# Patient Record
Sex: Female | Born: 1975 | Hispanic: Yes | Marital: Married | Smoking: Never smoker
Health system: Southern US, Community
[De-identification: ages and names within clinical notes are randomized; demographics above are authoritative.]

## PROBLEM LIST (undated history)

## (undated) DIAGNOSIS — N879 Dysplasia of cervix uteri, unspecified: Secondary | ICD-10-CM

## (undated) DIAGNOSIS — N393 Stress incontinence (female) (male): Secondary | ICD-10-CM

## (undated) DIAGNOSIS — E559 Vitamin D deficiency, unspecified: Secondary | ICD-10-CM

## (undated) DIAGNOSIS — Z87410 Personal history of cervical dysplasia: Secondary | ICD-10-CM

## (undated) DIAGNOSIS — Z8619 Personal history of other infectious and parasitic diseases: Secondary | ICD-10-CM

## (undated) DIAGNOSIS — M549 Dorsalgia, unspecified: Secondary | ICD-10-CM

## (undated) HISTORY — DX: Personal history of other infectious and parasitic diseases: Z86.19

---

## 2007-02-05 LAB — CONVERTED CEMR LAB: Pap Smear: NORMAL

## 2007-04-20 ENCOUNTER — Encounter (INDEPENDENT_AMBULATORY_CARE_PROVIDER_SITE_OTHER): Payer: Self-pay | Admitting: *Deleted

## 2007-05-01 ENCOUNTER — Encounter: Payer: Self-pay | Admitting: Internal Medicine

## 2008-02-21 ENCOUNTER — Ambulatory Visit: Payer: Self-pay | Admitting: *Deleted

## 2008-02-21 DIAGNOSIS — N329 Bladder disorder, unspecified: Secondary | ICD-10-CM | POA: Insufficient documentation

## 2008-02-21 DIAGNOSIS — R32 Unspecified urinary incontinence: Secondary | ICD-10-CM | POA: Insufficient documentation

## 2008-02-21 DIAGNOSIS — F329 Major depressive disorder, single episode, unspecified: Secondary | ICD-10-CM | POA: Insufficient documentation

## 2008-02-21 DIAGNOSIS — M5430 Sciatica, unspecified side: Secondary | ICD-10-CM

## 2011-05-31 ENCOUNTER — Encounter: Payer: Self-pay | Admitting: Gynecology

## 2011-05-31 ENCOUNTER — Ambulatory Visit (INDEPENDENT_AMBULATORY_CARE_PROVIDER_SITE_OTHER): Payer: Managed Care, Other (non HMO) | Admitting: Gynecology

## 2011-05-31 VITALS — BP 110/68 | Ht 59.5 in | Wt 165.0 lb

## 2011-05-31 DIAGNOSIS — N871 Moderate cervical dysplasia: Secondary | ICD-10-CM

## 2011-05-31 NOTE — Patient Instructions (Signed)
Patient information: Management of high grade cervical squamous intraepithelial lesions (HSIL) and glandular abnormalities (AGC) (Beyond the Basics)  Authors Lanna Poche, MD Thayer Ohm, MD Section Editor Alvera Novel, MD Deputy Editor Morton Amy, MD Disclosures  All topics are updated as new evidence becomes available and our peer review process is complete.  Literature review current through: Feb 2013.  This topic last updated: May 02, 2011.  ABNORMAL PAP SMEAR OVERVIEW -- High grade cervical squamous intraepithelial lesion (HSIL, also called high grade cervical intraepithelial neoplasia) is the name given to moderately to severely abnormal-appearing cells on a Pap smear (also called a cervical cytology test). Any woman with HSIL requires further evaluation to determine if cancerous cells are present. While only about 2 percent of women with HSIL have invasive cancer, up to 20 percent of women with HSIL will eventually develop cancer if the abnormality is not treated. Atypical glandular cells (AGC) is the name given to abnormal appearing glandular cells on a cervical cytology test. Glandular cells line the opening in the cervix (picture 1). AGC is a relatively uncommon result, although it always requires further evaluation. AGC can be caused by benign conditions, such as cervical polyps, or more serious conditions, such as cancer of the cervix, uterine lining (endometrium), ovary, or fallopian tube. This topic review discusses the management of women with high grade squamous intraepithelial lesions (HSIL) and glandular abnormalities (AGC) of the cervix. Management of atypical squamous cells (ASC-US and ASC-H) and low grade squamous intraepithelial lesions (LSIL) is discussed separately. (See "Patient information: Management of atypical squamous cells (ASC-US and ASC-H) and low grade cervical squamous intraepithelial lesions (LSIL) (Beyond the Basics)".) Cervical cancer  screening tests are also discussed in a separate topic review. (See "Patient information: Cervical cancer screening (Beyond the Basics)".) HIGH-GRADE SQUAMOUS LESION (HSIL) -- HSIL refers to moderate to severe precancerous changes of the cells of the cervix. Approximately 2 percent of women with HSIL on a Pap smear are found to have invasive cervical cancer when they undergo further evaluation and another 20 percent of women with HSIL will develop cervical cancer over a period of several years if they are not treated. However, if the precancerous lesion is removed or destroyed, cervical cancer can usually be prevented. Evaluation of HSIL -- All women with high-grade (HSIL) on Pap smear should have one of the following: Colposcopy of the cervix, including biopsy of any abnormal areas and endocervical curettage (ECC). Management after colposcopy depends upon the results. (See 'Colposcopy' below.)   If the healthcare provider is unable to see the entire cervix during colposcopy, surgical removal of the abnormal area is recommended (eg, loop electrosurgical excision, conization). (See "Patient information: Treatment of precancerous cells of the cervix (Beyond the Basics)".)  Alternatively, the woman and her provider may decide to remove the abnormal area at the time of the initial colposcopy. This is called "see and treat". (See "Patient information: Treatment of precancerous cells of the cervix (Beyond the Basics)".) This option is not recommended for adolescents and pregnant women. (See 'Special populations' below.) COLPOSCOPY -- Colposcopy is an office procedure that allows a clinician to closely examine the cervix. It is commonly performed after an abnormal Pap smear. Colposcopy is performed while the woman lies on an examination table, similar to a routine pelvic examination. A speculum is used to view the cervix, and the viewing device (called a colposcope) remains outside the woman's body (picture 1). The  colposcope magnifies the appearance of the cervix. This allows  the clinician to better see the location and size of any abnormalities, and also to see any changes in the capillaries (small blood vessels) on the surface of the cervix. Capillary changes are not detected by cervical cytology or human papillomavirus (HPV tests), but are important signs of the severity of cervical abnormalities. During colposcopy, a small piece of the abnormal area can be removed (biopsied). Anesthesia (numbing medicine) is not needed because the biopsy causes only mild discomfort or cramping. Women with HSIL or AGC usually require a biopsy of the inner cervix during colposcopy; this is called endocervical curettage (ECC). Endocervix refers to the inner cervix and curettage means scraping. Pregnant women should not have ECC because it may disturb the pregnancy. Management of HSIL after colposcopy -- Most women with HSIL results on a Pap smear will have a biopsy of any abnormal-appearing areas during colposcopy. The biopsy samples are sent to a pathologist, who determines if there is any evidence of precancerous changes, termed cervical intraepithelial neoplasia (CIN). These changes are categorized as being mild (CIN 1) or moderate to severe (CIN 2 or 3). The following management strategies apply to non-pregnant women who are 39 years-old or older. Management of adolescents and pregnant women is discussed separately (see 'Special populations' below). CIN 2 or 3 -- If the healthcare provider is able to see the entire cervix during colposcopy and the biopsy shows CIN 2 or 3, treatment to remove (excise) the abnormal area is recommended to prevent cancer. Delaying treatment (eg, watching and waiting) is not recommended for women age 66 or older with CIN 2 or 3, given the high risk of progression to cancer. (See "Patient information: Treatment of precancerous cells of the cervix (Beyond the Basics)".) CIN 1 or less preceded by HSIL --  Colposcopy can miss a significant number of seriously abnormal CIN lesions. If a woman has HSIL but the colposcopy/biopsy do not show a high grade lesion, the woman and her provider need to decide what else should be done to make sure a serious lesion has not been missed. The following options are available: Close monitoring, including cervical cytology and colposcopy at 6 and 12 months. At these visits, the provider must be able to see the entire cervix during colposcopy and a test of the inner cervix (called endocervical curettage) must be negative. This may be the preferred approach for younger women who would like to preserve their ability to carry a pregnancy in the future.  If these tests are negative, the woman may return to once yearly testing.  If either test show persistent HSIL, a treatment to remove the abnormal area is recommended. (See "Patient information: Treatment of precancerous cells of the cervix (Beyond the Basics)".)  Remove the abnormal area. Excision is recommended because it can both treat any abnormal areas and determine with certainty what abnormality was present. (See "Patient information: Treatment of precancerous cells of the cervix (Beyond the Basics)".)  In some cases, a healthcare provider will request an expert review of the woman's cytology and biopsy. This generally involves sending the cervical cytology and biopsy slides to an outside pathologist who is expert in evaluating abnormal Pap smears. If the expert feels that the woman has moderate to severe changes, a treatment to remove the abnormal area may be recommended. If the pathologist feels that there are mild to moderate changes, the woman and her provider may elect to monitor these changes with cervical cytology and colposcopy every six months.

## 2011-05-31 NOTE — Progress Notes (Signed)
Peggy Green is a gravida 2 para 2 that was referred to our practice from the general medical clinic in [place] (Dr. 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 Colposcopic evaluation in our office today as follows   Physical Exam  Genitourinary:       Pertinent Gynecological History: Menses: flow is moderate Bleeding: Moderate Contraception: condoms and Recent ParaGard IUD removed DES exposure: denies Blood transfusions: none Sexually transmitted diseases: Chlamydia Previous GYN Procedures: Colposcopy and biopsy  Last mammogram: No prior study Date: No prior study Last pap: See above Date: See above OB History: G2, P2   Menstrual History: Menarche age: 14 Patient's last menstrual period was 05/05/2011. Period Cycle (Days): 30  Period Duration (Days): 7-14 Period Pattern: Irregular Menstrual Flow: Heavy Dysmenorrhea: Severe Dysmenorrhea Symptoms: Cramping  History reviewed. No pertinent past medical history.  History reviewed. No pertinent past surgical history.  Family History  Problem Relation Age of Onset  . Cancer Maternal Grandfather     STOMACH    Social History:  reports that she has never smoked. She has never used smokeless tobacco. She reports that she drinks alcohol. She reports that she does not use illicit  drugs.  Allergies: No Known Allergies   (Not in a hospital admission)  @ROS@  Blood pressure 110/68, height 4' 11.5" (1.511 m), weight 165 lb (74.844 kg), last menstrual period 05/05/2011.  Physical Exam:  HEENT:unremarkable Neck:Supple, midline, no thyroid megaly, no carotid bruits Lungs:  Clear to auscultation no rhonchi's or wheezes Heart:Regular rate and rhythm, no murmurs or gallops Breast Exam: Not examined Abdomen: Soft nontender no rebound or guarding Pelvic:BUS no gross lesions on inspection or by colposcopic evaluation Vagina: No lesions seen on colposcopic evaluation Cervix: Please refer to above picture on colposcopic evaluation Uterus: Anteverted upper limits of normal Adnexa: No palpable masses or tenderness Extremities: No cords, no edema Rectal: Not done  No results found for this or any previous visit (from the past 24 hour(s)).  No results found.  Assessment/Plan: 35-year-old gravida 2 para 2 with CIN-1 (low-grade squamous intraepithelial lesion) on recent Pap smear. Patient underwent colposcopy and biopsy by her primary physician which demonstrated at the 6:00 position CIN-2 (high-grade squamous intraepithelial lesion/HPV). The high-grade dysplasia that was noted on colposcopic evaluation concur with the colposcopy done in our office today but due to to the large surface area it would be best evaluated and treated by doing the LEEP conization or possibly cold knife conization after application of Lugol solution in an outpatient surgical center under intravenous sedation and paracervical block. The above findings were discussed with the patient and her husband and the procedure recommended. The risks benefits and pros and cons were outlined. To include potential cervical stenosis infertility and cervical incompetence in the future. Patient not planning having more children so she states that this would not be an issue in the future. We discussed potential risk of  infection hemorrhage. All questions were answered and we'll schedule accordingly.  Peggy Green 05/31/2011, 4:48   PM   

## 2011-06-01 ENCOUNTER — Telehealth: Payer: Self-pay

## 2011-06-01 NOTE — Telephone Encounter (Signed)
Left message for patient to call me. I am holding time for Thursday April 4 7:30am and wondered if that might work for her.

## 2011-06-02 ENCOUNTER — Telehealth: Payer: Self-pay

## 2011-06-02 NOTE — Telephone Encounter (Signed)
Sister, Kandis Cocking called. She said patient told her I had left message that i was holding April  4th for her and she was supposed to call me back. Kandis Cocking is asking if anything sooner. I told her we could schedule her for March 26 7:30am.  She said she is going to call sister and talk with her about it and one of them will call me back. Meantime I scheduled the case to hold the time for Tuesday morning.

## 2011-06-06 ENCOUNTER — Telehealth: Payer: Self-pay

## 2011-06-06 NOTE — Telephone Encounter (Signed)
Patient returned my call.  I explained that I cannot talk with sister, Kandis Cocking regarding her health situation or her insurance benefits.  I told patient if she wished [place] to speak with her she would need to sign a HIPPA form and list her sister on it as being okay for [place] to communicate with regarding patient.  Patient is scheduled for surgery on March 26, 7:30am and we discussed her insurance benefits in detail.  She will call me if she has any questions.

## 2011-06-07 ENCOUNTER — Encounter (HOSPITAL_BASED_OUTPATIENT_CLINIC_OR_DEPARTMENT_OTHER): Payer: Self-pay | Admitting: *Deleted

## 2011-06-08 ENCOUNTER — Encounter (HOSPITAL_BASED_OUTPATIENT_CLINIC_OR_DEPARTMENT_OTHER): Payer: Self-pay | Admitting: *Deleted

## 2011-06-08 NOTE — Progress Notes (Signed)
NPO AFTER MN. ARRIVES AT 0600. NEEDS CBC, UA, AND URINE PREG. 

## 2011-06-14 ENCOUNTER — Encounter (HOSPITAL_BASED_OUTPATIENT_CLINIC_OR_DEPARTMENT_OTHER)
Admission: RE | Disposition: A | Discharge status: Home / Self Care | Payer: Self-pay | Source: Ambulatory Visit | Attending: Gynecology

## 2011-06-14 ENCOUNTER — Ambulatory Visit (HOSPITAL_BASED_OUTPATIENT_CLINIC_OR_DEPARTMENT_OTHER)
Admission: RE | Admit: 2011-06-14 | Discharge: 2011-06-14 | Disposition: A | Discharge status: Home / Self Care | Payer: Managed Care, Other (non HMO) | Source: Ambulatory Visit | Attending: Gynecology | Admitting: Gynecology

## 2011-06-14 ENCOUNTER — Encounter (HOSPITAL_BASED_OUTPATIENT_CLINIC_OR_DEPARTMENT_OTHER): Payer: Self-pay | Admitting: Anesthesiology

## 2011-06-14 ENCOUNTER — Ambulatory Visit (HOSPITAL_BASED_OUTPATIENT_CLINIC_OR_DEPARTMENT_OTHER): Payer: Managed Care, Other (non HMO) | Admitting: Anesthesiology

## 2011-06-14 ENCOUNTER — Encounter (HOSPITAL_BASED_OUTPATIENT_CLINIC_OR_DEPARTMENT_OTHER): Payer: Self-pay | Admitting: *Deleted

## 2011-06-14 DIAGNOSIS — N871 Moderate cervical dysplasia: Secondary | ICD-10-CM

## 2011-06-14 HISTORY — PX: LEEP: SHX91

## 2011-06-14 HISTORY — DX: Dysplasia of cervix uteri, unspecified: N87.9

## 2011-06-14 LAB — URINALYSIS, ROUTINE W REFLEX MICROSCOPIC
Bilirubin Urine: NEGATIVE
Hgb urine dipstick: NEGATIVE
Nitrite: NEGATIVE
Specific Gravity, Urine: 1.028 (ref 1.005–1.030)
Urobilinogen, UA: 0.2 mg/dL (ref 0.0–1.0)
pH: 5.5 (ref 5.0–8.0)

## 2011-06-14 SURGERY — LEEP (LOOP ELECTROSURGICAL EXCISION PROCEDURE)
Anesthesia: General | Site: Vagina | Wound class: Clean Contaminated

## 2011-06-14 MED ORDER — ONDANSETRON HCL 4 MG/2ML IJ SOLN
INTRAMUSCULAR | Status: DC | PRN
  Administered 2011-06-14: 4 mg via INTRAVENOUS

## 2011-06-14 MED ORDER — IODINE STRONG (LUGOLS) 5 % PO SOLN
ORAL | Status: DC | PRN
  Administered 2011-06-14: 0.1 mL

## 2011-06-14 MED ORDER — OXYCODONE-ACETAMINOPHEN 5-500 MG PO CAPS: 1.0000 | ORAL_CAPSULE | ORAL | Status: AC | PRN

## 2011-06-14 MED ORDER — FENTANYL CITRATE 0.05 MG/ML IJ SOLN
INTRAMUSCULAR | Status: DC | PRN
  Administered 2011-06-14: 50 ug via INTRAVENOUS
  Administered 2011-06-14: 25 ug via INTRAVENOUS
  Administered 2011-06-14: 50 ug via INTRAVENOUS
  Administered 2011-06-14: 25 ug via INTRAVENOUS

## 2011-06-14 MED ORDER — MIDAZOLAM HCL 5 MG/5ML IJ SOLN
INTRAMUSCULAR | Status: DC | PRN
  Administered 2011-06-14: 1 mg via INTRAVENOUS

## 2011-06-14 MED ORDER — PROPOFOL 10 MG/ML IV EMUL
INTRAVENOUS | Status: DC | PRN
  Administered 2011-06-14: 200 mg via INTRAVENOUS

## 2011-06-14 MED ORDER — KETOROLAC TROMETHAMINE 30 MG/ML IJ SOLN
INTRAMUSCULAR | Status: DC | PRN
  Administered 2011-06-14: 30 mg via INTRAVENOUS

## 2011-06-14 MED ORDER — SODIUM CHLORIDE 0.9 % IR SOLN
Status: DC | PRN
  Administered 2011-06-14: 500 mL

## 2011-06-14 MED ORDER — LIDOCAINE-EPINEPHRINE (PF) 1 %-1:200000 IJ SOLN
INTRAMUSCULAR | Status: DC | PRN
  Administered 2011-06-14: 20 mL

## 2011-06-14 MED ORDER — LACTATED RINGERS IV SOLN
INTRAVENOUS | Status: DC
Start: 2011-06-14 — End: 2011-06-14
  Administered 2011-06-14 (×3): via INTRAVENOUS

## 2011-06-14 MED ORDER — FENTANYL CITRATE 0.05 MG/ML IJ SOLN: 25.0000 ug | INTRAMUSCULAR | Status: DC | PRN

## 2011-06-14 MED ORDER — DEXAMETHASONE SODIUM PHOSPHATE 4 MG/ML IJ SOLN
INTRAMUSCULAR | Status: DC | PRN
  Administered 2011-06-14: 10 mg via INTRAVENOUS

## 2011-06-14 MED ORDER — ACETIC ACID 4% SOLUTION
Status: DC | PRN
  Administered 2011-06-14: 1 via TOPICAL

## 2011-06-14 MED ORDER — CLINDAMYCIN PHOSPHATE 2 % VA CREA: 1.0000 | TOPICAL_CREAM | Freq: Every day | VAGINAL | Status: AC

## 2011-06-14 MED ORDER — METOCLOPRAMIDE HCL 10 MG PO TABS: 10.0000 mg | ORAL_TABLET | Freq: Three times a day (TID) | ORAL | Status: AC

## 2011-06-14 MED ORDER — LIDOCAINE HCL (CARDIAC) 20 MG/ML IV SOLN
INTRAVENOUS | Status: DC | PRN
  Administered 2011-06-14: 80 mg via INTRAVENOUS

## 2011-06-14 SURGICAL SUPPLY — 53 items
APPLICATOR COTTON TIP 6IN STRL (MISCELLANEOUS) ×3 IMPLANT
BAG DECANTER FOR FLEXI CONT (MISCELLANEOUS) IMPLANT
BLADE SURG 11 STRL SS (BLADE) ×3 IMPLANT
BLADE SURG 12 STRL SS (BLADE) IMPLANT
CANISTER SUCTION 1200CC (MISCELLANEOUS) IMPLANT
CANISTER SUCTION 2500CC (MISCELLANEOUS) ×3 IMPLANT
CLEANER CAUTERY TIP 5X5 PAD (MISCELLANEOUS) ×2 IMPLANT
CLOTH BEACON ORANGE TIMEOUT ST (SAFETY) ×3 IMPLANT
COVER TABLE BACK 60X90 (DRAPES) ×3 IMPLANT
DRAPE LG THREE QUARTER DISP (DRAPES) IMPLANT
DRAPE UNDERBUTTOCKS STRL (DRAPE) ×3 IMPLANT
ELECT BALL LEEP 3MM BLK (ELECTRODE) ×3 IMPLANT
ELECT BALL LEEP 5MM RED (ELECTRODE) IMPLANT
ELECT LOOP LEEP RND 10X10 YLW (CUTTING LOOP)
ELECT LOOP LEEP RND 15X12 GRN (CUTTING LOOP)
ELECT LOOP LEEP RND 20X12 WHT (CUTTING LOOP) ×3
ELECT REM PT RETURN 9FT ADLT (ELECTROSURGICAL) ×3
ELECTRODE LOOP LP RND 10X10YLW (CUTTING LOOP) IMPLANT
ELECTRODE LOOP LP RND 15X12GRN (CUTTING LOOP) IMPLANT
ELECTRODE LOOP LP RND 20X12WHT (CUTTING LOOP) ×2 IMPLANT
ELECTRODE REM PT RTRN 9FT ADLT (ELECTROSURGICAL) ×2 IMPLANT
GLOVE BIO SURGEON STRL SZ 6.5 (GLOVE) ×3 IMPLANT
GLOVE BIOGEL PI IND STRL 7.5 (GLOVE) ×2 IMPLANT
GLOVE BIOGEL PI INDICATOR 7.5 (GLOVE) ×1
GLOVE ECLIPSE 6.0 STRL STRAW (GLOVE) ×3 IMPLANT
GLOVE ECLIPSE 7.5 STRL STRAW (GLOVE) ×3 IMPLANT
GLOVE INDICATOR 8.0 STRL GRN (GLOVE) ×3 IMPLANT
GLOVE SURG SS PI 7.0 STRL IVOR (GLOVE) ×3 IMPLANT
GOWN STRL REIN XL XLG (GOWN DISPOSABLE) ×3 IMPLANT
GOWN SURGICAL LARGE (GOWNS) ×3 IMPLANT
GOWN SURGICAL XLG (GOWNS) ×6 IMPLANT
GOWN W/2 COTTON TOWELS 2 STD (GOWNS) ×3 IMPLANT
LEGGING LITHOTOMY PAIR STRL (DRAPES) IMPLANT
NDL SAFETY ECLIPSE 18X1.5 (NEEDLE) ×2 IMPLANT
NEEDLE HYPO 18GX1.5 SHARP (NEEDLE) ×1
NS IRRIG 500ML POUR BTL (IV SOLUTION) ×3 IMPLANT
PACK BASIN DAY SURGERY FS (CUSTOM PROCEDURE TRAY) ×3 IMPLANT
PAD CLEANER CAUTERY TIP 5X5 (MISCELLANEOUS) ×1
PAD OB MATERNITY 4.3X12.25 (PERSONAL CARE ITEMS) ×3 IMPLANT
PAD PREP 24X48 CUFFED NSTRL (MISCELLANEOUS) ×3 IMPLANT
PENCIL BUTTON HOLSTER BLD 10FT (ELECTRODE) ×3 IMPLANT
SCOPETTES 8  STERILE (MISCELLANEOUS) ×2
SCOPETTES 8 STERILE (MISCELLANEOUS) ×4 IMPLANT
SUT VIC AB 0 CT1 36 (SUTURE) ×6 IMPLANT
SYR TB 1ML LL NO SAFETY (SYRINGE) ×3 IMPLANT
TOWEL NATURAL 6PK STERILE (DISPOSABLE) ×6 IMPLANT
TOWEL OR 17X24 6PK STRL BLUE (TOWEL DISPOSABLE) ×6 IMPLANT
TRAY DSU PREP LF (CUSTOM PROCEDURE TRAY) IMPLANT
TUBE CONNECTING 12X1/4 (SUCTIONS) ×3 IMPLANT
VACUUM HOSE 7/8X10 W/ WAND (MISCELLANEOUS) IMPLANT
VACUUM HOSE/TUBING 7/8INX6FT (MISCELLANEOUS) ×3 IMPLANT
WATER STERILE IRR 500ML POUR (IV SOLUTION) IMPLANT
YANKAUER SUCT BULB TIP NO VENT (SUCTIONS) IMPLANT

## 2011-06-14 NOTE — Anesthesia Preprocedure Evaluation (Signed)
Anesthesia Evaluation  Patient identified by MRN, date of birth, ID band Patient awake    Reviewed: Allergy & Precautions, H&P , NPO status , Patient's Chart, lab work & pertinent test results, reviewed documented beta blocker date and time   Airway Mallampati: II TM Distance: >3 FB Neck ROM: Full    Dental  (+) Teeth Intact   Pulmonary neg pulmonary ROS,  breath sounds clear to auscultation        Cardiovascular negative cardio ROS  Rhythm:Regular Rate:Normal  Denies cardiac symptoms   Neuro/Psych negative neurological ROS  negative psych ROS   GI/Hepatic negative GI ROS, Neg liver ROS,   Endo/Other  negative endocrine ROS  Renal/GU negative Renal ROS   Cervical dysplasia    Musculoskeletal negative musculoskeletal ROS (+)   Abdominal   Peds negative pediatric ROS (+)  Hematology negative hematology ROS (+)   Anesthesia Other Findings   Reproductive/Obstetrics negative OB ROS                           Anesthesia Physical Anesthesia Plan  ASA: I  Anesthesia Plan: General   Post-op Pain Management:    Induction: Intravenous  Airway Management Planned: LMA  Additional Equipment:   Intra-op Plan:   Post-operative Plan: Extubation in OR  Informed Consent: I have reviewed the patients History and Physical, chart, labs and discussed the procedure including the risks, benefits and alternatives for the proposed anesthesia with the patient or authorized representative who has indicated his/her understanding and acceptance.   Dental advisory given  Plan Discussed with: CRNA and Surgeon  Anesthesia Plan Comments:         Anesthesia Quick Evaluation

## 2011-06-14 NOTE — Op Note (Signed)
06/14/2011  8:33 AM  PATIENT:  Peggy Green  36 y.o. 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)  Colposcopic directed biopsy 05/13/2011 as follows:  Benign ECC  Cervical biopsy 1:00 koilocytotic atypia benign cervical mucosa  Cervical biopsy 3:00 focal: Koilocytotic atypia benign cervical mucosa  Cervical biopsy 6:00 high-grade squamous intraepithelial lesion (moderate dysplasia/CIN 2)  Cervical biopsy 11:00 fragments of benign endocervical mucosa negative for atypia   POST-OPERATIVE DIAGNOSIS:  high grade cervical dysplasia (CIN II)  PROCEDURE:  Procedure(s): LOOP ELECTROSURGICAL EXCISION PROCEDURE (LEEP)  SURGEON:  Surgeon(s): Ok Edwards, MD  ANESTHESIA:   general  FINDINGS: Acetowhite areas were noted to 6 and 12:00 position as had previously been seen with colposcopic evaluation. Further delineation was noted after Lugol's solution was applied.  DESCRIPTION OF OPERATION: The patient was taken to the operating room where she underwent successful general endotracheal anesthesia. Patient voided before she was brought back to the operating room. She was placed in the high lithotomy position and a speculum with sidewall retractors were placed in the vagina. Acetic acid was applied to dissolve the mucous and to enhance potential lesion. Following this Lugol's solution was used the paint the cervix in an effort to delineate the margins of the lesion. Once this was established the [facility] with a wire loop attachment (size 20 x 12) was utilized with the following setting: 30 W on the cutting mode and 40 W on the coagulation mode. An excision of the bottom half of the cervical cone separate from the upper half cervical cone excision had to be undertaken due to the size of the cervix. Following this a cervical button was obtained  separately as well. For hemostasis the working element was changed from the wire loop to the ball electrode to cauterize the cervical bed. (70 W on the [facility]). Silvadene was then used and applied to the cervical bed. The patient received 30 mg of Toradol in route to the recovery room after she was extubated. The specimens were pegged to a cork board and marked appropriately for histological evaluation.  ESTIMATED BLOOD LOSS: None  Intake/Output Summary (Last 24 hours) at 06/14/11 1610 Last data filed at 06/14/11 9604  Gross per 24 hour  Intake   1100 ml  Output      0 ml  Net   1100 ml     BLOOD ADMINISTERED:none   LOCAL MEDICATIONS USED:  LIDOCAINE 1% with 1 in 200,000 epinephrine (paracervical block) 20 cc  SPECIMEN:  Source of Specimen:  Cervical cone biopsy with cervical button  DISPOSITION OF SPECIMEN:  PATHOLOGY  COUNTS:  YES  PLAN OF CARE: Transfer to PACU  [facility] HMD8:33 AMTD@

## 2011-06-14 NOTE — Interval H&P Note (Signed)
History and Physical Interval Note:  06/14/2011 7:27 AM  Peggy Green  has presented today for surgery, with the diagnosis of high grade cervical dysplasia  The various methods of treatment have been discussed with the patient and family. After consideration of risks, benefits and other options for treatment, the patient has consented to  Procedure(s) (LRB): CONIZATION CERVIX WITH BIOPSY (N/A) LOOP ELECTROSURGICAL EXCISION PROCEDURE (LEEP) (N/A) as a surgical intervention .  The patients' history has been reviewed, patient examined, no change in status, stable for surgery.  I have reviewed the patients' chart and labs.  Questions were answered to the patient's satisfaction.     Ok Edwards

## 2011-06-14 NOTE — Anesthesia Postprocedure Evaluation (Signed)
  Anesthesia Post-op Note  Patient: Peggy Green  Procedure(s) Performed: Procedure(s) (LRB): LOOP ELECTROSURGICAL EXCISION PROCEDURE (LEEP) (N/A)  Patient Location: PACU  Anesthesia Type: General  Level of Consciousness: oriented and sedated  Airway and Oxygen Therapy: Patient Spontanous Breathing  Post-op Pain: mild  Post-op Assessment: Post-op Vital signs reviewed, Patient's Cardiovascular Status Stable, Respiratory Function Stable and Patent Airway  Post-op Vital Signs: stable  Complications: No apparent anesthesia complications

## 2011-06-14 NOTE — Transfer of Care (Signed)
Immediate Anesthesia Transfer of Care Note  Patient: Peggy Green  Procedure(s) Performed: Procedure(s) (LRB): LOOP ELECTROSURGICAL EXCISION PROCEDURE (LEEP) (N/A)  Patient Location: Patient transported to PACU with oxygen via face mask at 4 Liters / Min  Anesthesia Type: General  Level of Consciousness: awake and alert   Airway & Oxygen Therapy: Patient Spontanous Breathing and Patient connected to face mask oxygen  Post-op Assessment: Report given to PACU RN and Post -op Vital signs reviewed and stable  Post vital signs: Reviewed and stable  Dentition: Teeth and oropharynx remain in pre-op condition  Complications: No apparent anesthesia complications

## 2011-06-14 NOTE — Anesthesia Procedure Notes (Signed)
Procedure Name: LMA Insertion Date/Time: 06/14/2011 7:39 AM Performed by: Fran Lowes Pre-anesthesia Checklist: Patient identified, Emergency Drugs available, Suction available and Patient being monitored Patient Re-evaluated:Patient Re-evaluated prior to inductionOxygen Delivery Method: Circle System Utilized Preoxygenation: Pre-oxygenation with 100% oxygen Intubation Type: IV induction Ventilation: Mask ventilation without difficulty LMA: LMA inserted LMA Size: 4.0 Number of attempts: 1 Airway Equipment and Method: bite block Placement Confirmation: positive ETCO2 Tube secured with: Tape Dental Injury: Teeth and Oropharynx as per pre-operative assessment  Comments: LMA inserted by Dr. Shireen Quan.

## 2011-06-14 NOTE — H&P (View-Only) (Signed)
Peggy Green is a gravida 2 para 2 that was referred to our practice from the general medical clinic in [place] (Dr. 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 Colposcopic evaluation in our office today as follows   Physical Exam  Genitourinary:       Pertinent Gynecological History: Menses: flow is moderate Bleeding: Moderate Contraception: condoms and Recent ParaGard IUD removed DES exposure: denies Blood transfusions: none Sexually transmitted diseases: Chlamydia Previous GYN Procedures: Colposcopy and biopsy  Last mammogram: No prior study Date: No prior study Last pap: See above Date: See above OB History: G2, P2   Menstrual History: Menarche age: 67 Patient's last menstrual period was 05/05/2011. Period Cycle (Days): 30  Period Duration (Days): 7-14 Period Pattern: Irregular Menstrual Flow: Heavy Dysmenorrhea: Severe Dysmenorrhea Symptoms: Cramping  History reviewed. No pertinent past medical history.  History reviewed. No pertinent past surgical history.  Family History  Problem Relation Age of Onset  . Cancer Maternal Grandfather     STOMACH    Social History:  reports that she has never smoked. She has never used smokeless tobacco. She reports that she drinks alcohol. She reports that she does not use illicit  drugs.  Allergies: No Known Allergies   (Not in a hospital admission)  @ROS @  Blood pressure 110/68, height 4' 11.5" (1.511 m), weight 165 lb (74.844 kg), last menstrual period 05/05/2011.  Physical Exam:  HEENT:unremarkable Neck:Supple, midline, no thyroid megaly, no carotid bruits Lungs:  Clear to auscultation no rhonchi's or wheezes Heart:Regular rate and rhythm, no murmurs or gallops Breast Exam: Not examined Abdomen: Soft nontender no rebound or guarding Pelvic:BUS no gross lesions on inspection or by colposcopic evaluation Vagina: No lesions seen on colposcopic evaluation Cervix: Please refer to above picture on colposcopic evaluation Uterus: Anteverted upper limits of normal Adnexa: No palpable masses or tenderness Extremities: No cords, no edema Rectal: Not done  No results found for this or any previous visit (from the past 24 hour(s)).  No results found.  Assessment/Plan: 36 year old gravida 2 para 2 with CIN-1 (low-grade squamous intraepithelial lesion) on recent Pap smear. Patient underwent colposcopy and biopsy by her primary physician which demonstrated at the 6:00 position CIN-2 (high-grade squamous intraepithelial lesion/HPV). The high-grade dysplasia that was noted on colposcopic evaluation concur with the colposcopy done in our office today but due to to the large surface area it would be best evaluated and treated by doing the LEEP conization or possibly cold knife conization after application of Lugol solution in an outpatient surgical center under intravenous sedation and paracervical block. The above findings were discussed with the patient and her husband and the procedure recommended. The risks benefits and pros and cons were outlined. To include potential cervical stenosis infertility and cervical incompetence in the future. Patient not planning having more children so she states that this would not be an issue in the future. We discussed potential risk of  infection hemorrhage. All questions were answered and we'll schedule accordingly.  Ok Edwards 05/31/2011, 4:48  PM

## 2011-06-15 ENCOUNTER — Encounter (HOSPITAL_BASED_OUTPATIENT_CLINIC_OR_DEPARTMENT_OTHER): Payer: Self-pay | Admitting: Gynecology

## 2011-06-20 ENCOUNTER — Telehealth: Payer: Self-pay | Admitting: *Deleted

## 2011-06-20 NOTE — Telephone Encounter (Signed)
Pt called requesting BX results, left message on pt Vm that results are not back yet.

## 2011-06-28 ENCOUNTER — Ambulatory Visit: Payer: Managed Care, Other (non HMO) | Admitting: Gynecology

## 2011-07-01 ENCOUNTER — Telehealth: Payer: Self-pay | Admitting: *Deleted

## 2011-07-01 NOTE — Telephone Encounter (Signed)
(  PT AWARE YOU ARE OUT OF THE OFFICE) PT IS CALLING TO FOLLOW UP WITH BIOPSY RESULTS FROM LEEP ON 3/26. PLEASE ADVISE

## 2011-07-06 ENCOUNTER — Encounter: Payer: Self-pay | Admitting: Gynecology

## 2011-07-06 NOTE — Progress Notes (Unsigned)
Patient ID: Peggy Green, female   DOB: [birth year], 36 y.o.   MRN: [id number]  Spoke with pathologist 07/05/2011 in reference to patients prior cervical biopsies performed at another institution and asked him to reread the slides and they concurred for HGSIL (CIN II). The LEEP cervical biopsy was benign and he referenced an article based on these clinical findings as outlined below which was shared with patient via telephone and will be discussed when she returns to the office for post op appointment:  Diagnosis 1. Consult Slide , endocervix biopsy - SCANT FRAGMENTS OF BENIGN ENDOCERVICAL GLANDULAR EPITHELIUM, SEE COMMENT. 2. Consult Slide , Cervix biopsy 1 o'clock - CERVICAL MUCOSA WITH KOILOCYTIC ATYPIA CONSISTENT WITH HUMAN PAPILLOMAVIRUS (HPV) EFFECT SEE COMMENT. 3. Consult Slide , Cervix biopsy 3 o'clock - CERVICAL MUCOSA WITH KOILOCYTIC ATYPIA CONSISTENT WITH HUMAN PAPILLOMAVIRUS (HPV) EFFECT SEE COMMENT 4. Consult Slide , Cervix biopsy 6 o'clock - HIGH GRADE SQUAMOUS INTRAEPITHELIAL LESION, CIN-II (MODERATE DYSPLASIA) SEE COMMENT 5. Consult Slide , Cervix biopsy 11 o'clock - BENIGN ENDOCERVICAL MUCOSA, SEE COMMENT. - DETACHED FRAGMENTS OF SQUAMOUS EPITHELIUM, NEGATIVE FOR INTRAEPITHELIAL LESION OR MALIGNANCY. Microscopic Comment 1. 2, 3, 4, and 5. Per the request of Dr. Lily Green, the case was sent to [facility] for a second diagnostic opinion. 4. A p16 immunostain demonstrates diffuse block-like expression in the dysplastic epithelium, confirming the routine morphologic impression. 5. Although there is squamous epithelium present, there is no bona fide cervical transformational zone present. (CRR:eps 07/01/11) Peggy RUND Peggy Green   Diagnosis Cervix, LEEP, upper half and cervical button - BENIGN CERVICAL AND ENDOCERVICAL BUTTON MUCOSA, SEE COMMENT. - NEGATIVE FOR INTRAEPITHELIAL LESION OR MALIGNANCY.Addendum to follow stating that lower have of LEEP was  benign  Literature article:  Negative Loop Electrosurgical Cone Biopsy Finding Following a Biopsy Diagnosis of High-Grade Squamous Intraepithelial Lesion Frequency and Clinical Significance Peggy L. Marlana Salvage, MD; Peggy Langton. Factor, MD; Peggy Cunas. Mathis Dad, MD; Peggy Ports, MD N Context  .--Loop electrosurgical excision procedure (LEEP) is a therapeutic option following biopsy diagnosis of highgrade squamous intraepithelial lesion (HSIL). Most LEEPs will confirmthe HSIL biopsy diagnosis but a number of them will not. Such negative findings suggest the possibility of an incorrect biopsy diagnosis, removal of the lesion by biopsy, or insufficient LEEP sampling. Objective.--To determine the frequency of negative LEEP findings following HSIL biopsies and better understand the clinical significance of negative LEEP findings. Design.--The Department of Pathology's records were searched for all patients undergoing LEEP excision who had prior cervical biopsies and subsequent clinical follow-up. Results.--Three hundred seventy-eight women were found who had index biopsies, subsequent LEEPs, and clinical follow-up averaging 25.8 months. Three hundred six women had HSIL on biopsy with 223 (73%) showing HSIL on LEEP. Seventy-three (24%) LEEPs in women with HSIL index biopsy results yielded negative findings or disclosed low-grade squamous intraepithelial lesion (LSIL). Twenty-nine of 223 patients (13%) with an HSIL result both on biopsies and LEEPs had HSIL on biopsy and/or excisional clinical follow-up. Seven of 73 patients (10%) with positive (HSIL) biopsy results but negative LEEP findings or LSIL had HSIL on biopsy and/or excisional follow-up.  Conclusions.--Twenty-four percent of patients with HSIL on biopsy had negative findings or LSIL on LEEP. There is no statistical difference in development of HSIL after LEEP for those with positive biopsy and positive LEEP results (13%) versus  positive biopsy and negative LEEP results (10%). The occurrence of a negative LEEP finding following a positive biopsy finding was frequent (24%) and does not portend a different clinical follow-up from  a positive biopsy and positive LEEP result. (Arch Pathol Lab Med. 2012;136:1259-1261; doi: [email or web address])

## 2011-07-07 NOTE — Telephone Encounter (Signed)
Per JF pt has OV on 07/13/11

## 2011-07-13 ENCOUNTER — Encounter: Payer: Self-pay | Admitting: Gynecology

## 2011-07-13 ENCOUNTER — Ambulatory Visit (INDEPENDENT_AMBULATORY_CARE_PROVIDER_SITE_OTHER): Payer: Managed Care, Other (non HMO) | Admitting: Gynecology

## 2011-07-13 VITALS — BP 126/78

## 2011-07-13 DIAGNOSIS — N871 Moderate cervical dysplasia: Secondary | ICD-10-CM

## 2011-07-13 DIAGNOSIS — Z9889 Other specified postprocedural states: Secondary | ICD-10-CM

## 2011-07-13 NOTE — Progress Notes (Signed)
Patient 36 year old who presented to the office today for first postop visit she is status post LEEP cervical conization as a result of high-grade cervical dysplasia (CIN 2). 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 - NEGATIVE FOR INTRAEPITHELIAL LESION OR MALIGNANCY  Exam: Bartholin urethra Skene was within normal limits Vagina: No gross lesions or discharge  Cervix: LEEP cone biopsy bed healing well some small areas were oozing which were contained with silver nitrate Bimanual exam: Not done Rectal exam: Not done  Spoke with pathologist 07/05/2011 in reference to patients prior cervical biopsies performed at another institution and asked him to reread the slides and they concurred for HGSIL (CIN II). The LEEP cervical biopsy was benign and he referenced an article based on these clinical findings as outlined below which was shared with patient via telephone which was discussed with the patient today when she came for her postop appointment. The study that he had quoted from the Archives of pathology the conclusion from the studies was as follows:  Twenty-four percent of patients with  HSIL on biopsy had negative findings or LSIL on LEEP.  There is no statistical difference in development of HSIL  after LEEP for those with positive biopsy and positive  LEEP  results (13%) versus positive biopsy and negative LEEP  results (10%). The occurrence of a negative LEEP finding  following a positive biopsy finding was frequent (24%) and  does not portend a different clinical follow-up from a  positive biopsy and positive LEEP result.  (Arch Pathol Lab Med. 2012;136:1259-1261; doi:  [email or web address])   This was shared with the patient and she was reassured. She will return back to the office for final postop visit in 4 weeks and I recommend she has followup Pap smears every 6 months For a few years for close surveillance.

## 2011-07-13 NOTE — Patient Instructions (Signed)
No tub baths or intercourse for two more weeks.

## 2011-08-03 ENCOUNTER — Encounter: Payer: Self-pay | Admitting: Gynecology

## 2011-08-03 ENCOUNTER — Ambulatory Visit (INDEPENDENT_AMBULATORY_CARE_PROVIDER_SITE_OTHER): Payer: Managed Care, Other (non HMO) | Admitting: Gynecology

## 2011-08-03 VITALS — BP 126/80

## 2011-08-03 DIAGNOSIS — Z9889 Other specified postprocedural states: Secondary | ICD-10-CM

## 2011-08-03 NOTE — Progress Notes (Signed)
Patient presented to the office today for final postop visit she status post LEEP as a result of a colposcopic directed biopsy which demonstrated CIN-2. 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  - NEGATIVE FOR INTRAEPITHELIAL LESION OR MALIGNANCY   Exam:  Bartholin urethra Skene was within normal limits  Vagina: No gross lesions or discharge  Cervix: LEEP cone biopsy bed completely healed Bimanual exam: Not done  Rectal exam:   Once again we discussed the following:  Spoke with pathologist 07/05/2011 in reference to patients prior cervical biopsies performed at another institution and asked him to reread the slides and they concurred for HGSIL (CIN II). The LEEP cervical biopsy was benign and he referenced an article based on these clinical findings as outlined below which was shared with patient via telephone which was discussed with the patient today when she came for her postop appointment. The study that he had quoted from the Archives of pathology the conclusion from the studies was as follows:  Twenty-four percent of patients with  HSIL on biopsy had negative findings or LSIL on LEEP.  There is no statistical difference in development of HSIL  after LEEP for those with positive biopsy and positive LEEP  results (13%) versus positive biopsy and negative  LEEP  results (10%). The occurrence of a negative LEEP finding  following a positive biopsy finding was frequent (24%) and  does not portend a different clinical follow-up from a  positive biopsy and positive LEEP result.  (Arch Pathol Lab Med. 2012;136:1259-1261; doi:  [email or web address])   Assessment/plan: Patient with history of CIN-2 on biopsy negative on LEEP cone. Patient will be followed up with Pap smears every 6 months for a few years and then return to normal screening. She will be returned to the office at the start of her next menstrual cycle since she would like to have a Mirena IUD placed. Literature information was provided all questions were answered we'll follow accordingly.

## 2011-08-03 NOTE — Patient Instructions (Signed)
Call us when your period starts so that we can place the Mirena IUD. In December we will do your full annual exam and follow up pap smear.

## 2011-08-08 ENCOUNTER — Encounter: Payer: Self-pay | Admitting: Gynecology

## 2011-08-08 ENCOUNTER — Telehealth: Payer: Self-pay | Admitting: *Deleted

## 2011-08-08 ENCOUNTER — Ambulatory Visit (INDEPENDENT_AMBULATORY_CARE_PROVIDER_SITE_OTHER): Payer: Managed Care, Other (non HMO) | Admitting: Gynecology

## 2011-08-08 VITALS — BP 120/80

## 2011-08-08 DIAGNOSIS — Z30430 Encounter for insertion of intrauterine contraceptive device: Secondary | ICD-10-CM

## 2011-08-08 NOTE — Telephone Encounter (Signed)
Pt informed of Mirena benefits for a copay only. KW

## 2011-08-08 NOTE — Progress Notes (Signed)
Patient presented to the office today to have the Mirena IUD placed for contraception as well as her cycle control. She suffers from heavy periods that last 5-7 days. In the past she has had a ParaGard T380A IUD. The first one that was placed was expelled spontaneously after was placed 6 weeks postpartum. The second ParaGard IUD she is for 10 years and was removed earlier part of this year. She had been given literature information the Mirena IUD. See previous note outlining her cervical dysplasia for which she's been followed closely.  The consent form was signed the risks benefits pros and cons of the Mirena IUD were discussed. Patient fully where this form of contraception is 99% effective and is good for 5 years. Patient's currently on her second day of her menstrual cycle.  Procedure note: A bimanual exam was undertaken first and demonstrated the uterus to be of normal size anteverted with no palpable masses or tenderness. A speculum was inserted and the cervix was cleansed with Betadine solution. A single-tooth tenaculum was placed on the anterior cervical lip. The uterus sounded to 7 cm. The Mirena IUD was then inserted in a sterile fashion taking into consideration the uterine depth. The string was cut. Patient will return back to the office in one month for followup. She will also return in 6 months for followup Pap smear.

## 2011-08-08 NOTE — Patient Instructions (Signed)
Intrauterine Device Information An intrauterine device (IUD) is inserted into your uterus and prevents pregnancy. There are 2 types of IUDs available:  Copper IUD. This type of IUD is wrapped in copper wire and is placed inside the uterus. Copper makes the uterus and fallopian tubes produce a fluid that kills sperm. The copper IUD can stay in place for 10 years.   Hormone IUD. This type of IUD contains the hormone progestin (synthetic progesterone). The hormone thickens the cervical mucus and prevents sperm from entering the uterus, and it also thins the uterine lining to prevent implantation of a fertilized egg. The hormone can weaken or kill the sperm that get into the uterus. The hormone IUD can stay in place for 5 years.  Your caregiver will make sure you are a good candidate for a contraceptive IUD. Discuss with your caregiver the possible side effects. ADVANTAGES  It is highly effective, reversible, long-acting, and low maintenance.   There are no estrogen-related side effects.   An IUD can be used when breastfeeding.   It is not associated with weight gain.   It works immediately after insertion.   The copper IUD does not interfere with your female hormones.   The progesterone IUD can make heavy menstrual periods lighter.   The progesterone IUD can be used for 5 years.   The copper IUD can be used for 10 years.  DISADVANTAGES  The progesterone IUD can be associated with irregular bleeding patterns.   The copper IUD can make your menstrual flow heavier and more painful.   You may experience cramping and vaginal bleeding after insertion.  Document Released: 02/09/2004 Document Revised: 02/24/2011 Document Reviewed: 07/10/2010 ExitCare Patient Information 2012 [employer]. 

## 2011-09-02 ENCOUNTER — Ambulatory Visit: Payer: Managed Care, Other (non HMO) | Admitting: Gynecology

## 2011-12-20 ENCOUNTER — Encounter: Payer: Self-pay | Admitting: *Deleted

## 2011-12-20 ENCOUNTER — Encounter: Payer: Self-pay | Admitting: Gynecology

## 2011-12-20 ENCOUNTER — Ambulatory Visit (INDEPENDENT_AMBULATORY_CARE_PROVIDER_SITE_OTHER): Payer: Managed Care, Other (non HMO) | Admitting: Gynecology

## 2011-12-20 ENCOUNTER — Other Ambulatory Visit (HOSPITAL_COMMUNITY)
Admission: RE | Admit: 2011-12-20 | Discharge: 2011-12-20 | Disposition: A | Discharge status: Home / Self Care | Payer: Managed Care, Other (non HMO) | Source: Ambulatory Visit | Attending: Gynecology | Admitting: Gynecology

## 2011-12-20 ENCOUNTER — Telehealth: Payer: Self-pay | Admitting: *Deleted

## 2011-12-20 ENCOUNTER — Ambulatory Visit (INDEPENDENT_AMBULATORY_CARE_PROVIDER_SITE_OTHER): Payer: Managed Care, Other (non HMO)

## 2011-12-20 VITALS — BP 120/74

## 2011-12-20 DIAGNOSIS — IMO0002 Reserved for concepts with insufficient information to code with codable children: Secondary | ICD-10-CM

## 2011-12-20 DIAGNOSIS — N949 Unspecified condition associated with female genital organs and menstrual cycle: Secondary | ICD-10-CM

## 2011-12-20 DIAGNOSIS — Z01419 Encounter for gynecological examination (general) (routine) without abnormal findings: Secondary | ICD-10-CM | POA: Insufficient documentation

## 2011-12-20 DIAGNOSIS — N8300 Follicular cyst of ovary, unspecified side: Secondary | ICD-10-CM

## 2011-12-20 DIAGNOSIS — Z30431 Encounter for routine checking of intrauterine contraceptive device: Secondary | ICD-10-CM

## 2011-12-20 DIAGNOSIS — N390 Urinary tract infection, site not specified: Secondary | ICD-10-CM

## 2011-12-20 DIAGNOSIS — N83209 Unspecified ovarian cyst, unspecified side: Secondary | ICD-10-CM

## 2011-12-20 DIAGNOSIS — T839XXA Unspecified complication of genitourinary prosthetic device, implant and graft, initial encounter: Secondary | ICD-10-CM

## 2011-12-20 DIAGNOSIS — Z1151 Encounter for screening for human papillomavirus (HPV): Secondary | ICD-10-CM | POA: Insufficient documentation

## 2011-12-20 DIAGNOSIS — R102 Pelvic and perineal pain unspecified side: Secondary | ICD-10-CM

## 2011-12-20 DIAGNOSIS — N83202 Unspecified ovarian cyst, left side: Secondary | ICD-10-CM

## 2011-12-20 DIAGNOSIS — R300 Dysuria: Secondary | ICD-10-CM

## 2011-12-20 DIAGNOSIS — Z8742 Personal history of other diseases of the female genital tract: Secondary | ICD-10-CM

## 2011-12-20 LAB — URINALYSIS W MICROSCOPIC + REFLEX CULTURE
Bilirubin Urine: NEGATIVE
Casts: NONE SEEN
Crystals: NONE SEEN
Glucose, UA: NEGATIVE mg/dL
Ketones, ur: NEGATIVE mg/dL
Specific Gravity, Urine: 1.025 (ref 1.005–1.030)
Urobilinogen, UA: 0.2 mg/dL (ref 0.0–1.0)
pH: 5.5 (ref 5.0–8.0)

## 2011-12-20 MED ORDER — NITROFURANTOIN MONOHYD MACRO 100 MG PO CAPS: 100.0000 mg | ORAL_CAPSULE | Freq: Two times a day (BID) | ORAL | Status: DC

## 2011-12-20 MED ORDER — MEDROXYPROGESTERONE ACETATE 150 MG/ML IM SUSP
150.0000 mg | Freq: Once | INTRAMUSCULAR | Status: AC
  Administered 2011-12-20: 150 mg via INTRAMUSCULAR

## 2011-12-20 NOTE — Telephone Encounter (Signed)
Message copied by Aura Camps on Tue Dec 20, 2011  4:22 PM ------      Message from: Ok Edwards      Created: Tue Dec 20, 2011  2:17 PM       Please inform patient that when she left the office I received her urinalysis and based on the findings as well as with her symptoms I would like to start her on Macrobid for her urinary tract infection. I would like her to take 1 tablet twice a day for 7 days. A prescription was e prescribed and is ready to be picked up at her pharmacy.

## 2011-12-20 NOTE — Patient Instructions (Addendum)
Ovarian Cyst The ovaries are small organs that are on each side of the uterus. The ovaries are the organs that produce the female hormones, estrogen and progesterone. An ovarian cyst is a sac filled with fluid that can vary in its size. It is normal for a small cyst to form in women who are in the childbearing age and who have menstrual periods. This type of cyst is called a follicle cyst that becomes an ovulation cyst (corpus luteum cyst) after it produces the women's egg. It later goes away on its own if the woman does not become pregnant. There are other kinds of ovarian cysts that may cause problems and may need to be treated. The most serious problem is a cyst with cancer. It should be noted that menopausal women who have an ovarian cyst are at a higher risk of it being a cancer cyst. They should be evaluated very quickly, thoroughly and followed closely. This is especially true in menopausal women because of the high rate of ovarian cancer in women in menopause. CAUSES AND TYPES OF OVARIAN CYSTS:  FUNCTIONAL CYST: The follicle/corpus luteum cyst is a functional cyst that occurs every month during ovulation with the menstrual cycle. They go away with the next menstrual cycle if the woman does not get pregnant. Usually, there are no symptoms with a functional cyst.   ENDOMETRIOMA CYST: This cyst develops from the lining of the uterus tissue. This cyst gets in or on the ovary. It grows every month from the bleeding during the menstrual period. It is also called a "chocolate cyst" because it becomes filled with blood that turns brown. This cyst can cause pain in the lower abdomen during intercourse and with your menstrual period.   CYSTADENOMA CYST: This cyst develops from the cells on the outside of the ovary. They usually are not cancerous. They can get very big and cause lower abdomen pain and pain with intercourse. This type of cyst can twist on itself, cut off its blood supply and cause severe pain.  It also can easily rupture and cause a lot of pain.   DERMOID CYST: This type of cyst is sometimes found in both ovaries. They are found to have different kinds of body tissue in the cyst. The tissue includes skin, teeth, hair, and/or cartilage. They usually do not have symptoms unless they get very big. Dermoid cysts are rarely cancerous.   POLYCYSTIC OVARY: This is a rare condition with hormone problems that produces many small cysts on both ovaries. The cysts are follicle-like cysts that never produce an egg and become a corpus luteum. It can cause an increase in body weight, infertility, acne, increase in body and facial hair and lack of menstrual periods or rare menstrual periods. Many women with this problem develop type 2 diabetes. The exact cause of this problem is unknown. A polycystic ovary is rarely cancerous.   THECA LUTEIN CYST: Occurs when too much hormone (human chorionic gonadotropin) is produced and over-stimulates the ovaries to produce an egg. They are frequently seen when doctors stimulate the ovaries for invitro-fertilization (test tube babies).   LUTEOMA CYST: This cyst is seen during pregnancy. Rarely it can cause an obstruction to the birth canal during labor and delivery. They usually go away after delivery.  SYMPTOMS   Pelvic pain or pressure.   Pain during sexual intercourse.   Increasing girth (swelling) of the abdomen.   Abnormal menstrual periods.   Increasing pain with menstrual periods.   You stop having   menstrual periods and you are not pregnant.  DIAGNOSIS  The diagnosis can be made during:  Routine or annual pelvic examination (common).   Ultrasound.   X-ray of the pelvis.   CT Scan.   MRI.   Blood tests.  TREATMENT   Treatment may only be to follow the cyst monthly for 2 to 3 months with your caregiver. Many go away on their own, especially functional cysts.   May be aspirated (drained) with a long needle with ultrasound, or by laparoscopy  (inserting a tube into the pelvis through a small incision).   The whole cyst can be removed by laparoscopy.   Sometimes the cyst may need to be removed through an incision in the lower abdomen.   Hormone treatment is sometimes used to help dissolve certain cysts.   Birth control pills are sometimes used to help dissolve certain cysts.  HOME CARE INSTRUCTIONS  Follow your caregiver's advice regarding:  Medicine.   Follow up visits to evaluate and treat the cyst.   You may need to come back or make an appointment with another caregiver, to find the exact cause of your cyst, if your caregiver is not a gynecologist.   Get your yearly and recommended pelvic examinations and Pap tests.   Let your caregiver know if you have had an ovarian cyst in the past.  SEEK MEDICAL CARE IF:   Your periods are late, irregular, they stop, or are painful.   Your stomach (abdomen) or pelvic pain does not go away.   Your stomach becomes larger or swollen.   You have pressure on your bladder or trouble emptying your bladder completely.   You have painful sexual intercourse.   You have feelings of fullness, pressure, or discomfort in your stomach.   You lose weight for no apparent reason.   You feel generally ill.   You become constipated.   You lose your appetite.   You develop acne.   You have an increase in body and facial hair.   You are gaining weight, without changing your exercise and eating habits.   You think you are pregnant.  SEEK IMMEDIATE MEDICAL CARE IF:   You have increasing abdominal pain.   You feel sick to your stomach (nausea) and/or vomit.   You develop a fever that comes on suddenly.   You develop abdominal pain during a bowel movement.   Your menstrual periods become heavier than usual.  Document Released: 03/07/2005 Document Revised: 02/24/2011 Document Reviewed: 01/08/2009 Exit[employer] Patient Information 2012 [employer]. 

## 2011-12-20 NOTE — Telephone Encounter (Signed)
Left message on pt voicemail rx sent to pharmacy.

## 2011-12-20 NOTE — Progress Notes (Signed)
Patient presented to the office today for followup Pap smear also been complaining of pelvic pressure and dyspareunia recently. 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 Consult Slide , endocervix biopsy - SCANT FRAGMENTS OF BENIGN ENDOCERVICAL GLANDULAR EPITHELIUM, SEE COMMENT. 2. Consult Slide , Cervix biopsy 1 o'clock - CERVICAL MUCOSA WITH KOILOCYTIC ATYPIA CONSISTENT WITH HUMAN PAPILLOMAVIRUS (HPV) EFFECT SEE COMMENT. 3. Consult Slide , Cervix biopsy 3 o'clock - CERVICAL MUCOSA WITH KOILOCYTIC ATYPIA CONSISTENT WITH HUMAN PAPILLOMAVIRUS (HPV) EFFECT SEE COMMENT 4. Consult Slide , Cervix biopsy 6 o'clock - HIGH GRADE SQUAMOUS INTRAEPITHELIAL LESION, CIN-II (MODERATE DYSPLASIA) SEE COMMENT 5. Consult Slide , Cervix biopsy 11 o'clock - BENIGN ENDOCERVICAL MUCOSA, SEE COMMENT. - DETACHED FRAGMENTS OF SQUAMOUS EPITHELIUM, NEGATIVE FOR INTRAEPITHELIAL LESION OR MALIGNANCY.  Addendum and correction by pathologists as follows:  REPORT OF SURGICAL PATHOLOGY REASON FOR ADDENDUM, AMENDMENT OR CORRECTION: [id number]: Clarification of tissue source. kh 07/05/11 04:17:37 PM FINAL DIAGNOSIS Diagnosis Cervix, LEEP, bottom half, upper half and cervical button - BENIGN CERVICAL AND ENDOCERVICAL BUTTON MUCOSA, SEE COMMENT. - NEGATIVE FOR INTRAEPITHELIAL LESION OR MALIGNANCY. Microscopic  Comment The entire specimen was submitted for pathologic review. In addition, numerous recut sections were examined. No cervical dysplasia was identified in any of the sections or recut sections examined. There were areas of inflamed metaplastic squamous epithelium that did not demonstrate aberrant p16 immunostain expression. The initial findings of the LEEP was discussed with Dr. Lily Peer on 06/16/2011 Per the request of Dr. Lily Peer, the Pap smear and correlating biopsies were requested from [facility]. On review of the [facility] Pap smear ([id number]), I interpret the Pap findings as ASCUS. Dr. Dierdre Searles has also reviewed the Pap smear and concurs. The corresponding [facility] biopsy ([id number]) demonstrating high grade squamous intraepithelial lesion (Part B) was re-reviewed. I concur with the outside interpretation of high grade squamous intraepithelial lesion. A p16 immunostain was performed and demonstrated diffuse block-like expression in the dysplastic epithelium; confirming the routine morphologic impression. . Following review of the outside Pap smear and biopsies, the case was re-discussed Dr. Lily Peer on 07/04/2011. (CRR:eps:mw 06/16/11: 07/01/11) AMENDMENT: The tissue source designated "bottom half" was inadvertently not designated in the specimen source. The specimen source was corrected to reflect "bottom half" as a tissue source. The case was discussed with Dr. Lily Peer on 07/05/2011. (CR:kh 07/05/11)  Patient denied any vaginal discharge she is increasing her her I. Skene soda intake. She states her cycles are regular and she has a Mirena IUD that was placed on may 22,013.  Exam: Abdomen soft nontender no rebound guarding Pelvic: Bartholin urethra Skene was within normal limits Vagina: No lesions or discharge cervix: No lesions or discharge uterus anteverted slight fullness noted in the left adnexa and some tenderness. Right adnexa no palpable masses or tenderness Rectal exam: Not  done  The ultrasound demonstrated uterus to measure 9.0 x 4.9 x 4.4 cm with endometrial stripe 4.7 millimeter. IUD was seen in the normal position. Uterus homogeneous. Right ovary follicle 27 x 23 mm echo-free. Left ovary thinwall  echo-free avascular cyst measuring 4.7 x 4.0 x 3.6 cm average size 4.1 cm negative fluid in the cul-de-sac.  Her urinalysis demonstrated small amount of leukocytes, small amount of blood, 1120 WBC, few bacteria. Urine sent for culture. She will be placed on Macrobid one by mouth twice a day for 7 days. She was given Depo-Provera 150 mg IM to suppress the ovarian cyst and she will return back to the office in 4 months for followup ultrasound as well as for her annual exam. Pap smear was done today. We'll continue to follow with Pap smears every year due to her high-risk dysplasia history. All the above was explained Spanish and we'll follow accordingly.

## 2011-12-22 ENCOUNTER — Other Ambulatory Visit: Payer: Self-pay | Admitting: Gynecology

## 2011-12-22 DIAGNOSIS — R3129 Other microscopic hematuria: Secondary | ICD-10-CM

## 2011-12-27 ENCOUNTER — Encounter: Payer: Self-pay | Admitting: *Deleted

## 2011-12-27 NOTE — Progress Notes (Signed)
Patient ID: Peggy Green, female   DOB: [birth year], 36 y.o.   MRN: [id number] Pt left message in voicemail if okay to wear a tampon with ovary cyst that she has. I left message on pt voicemail this is okay or pad as well.

## 2011-12-30 ENCOUNTER — Other Ambulatory Visit: Payer: Managed Care, Other (non HMO)

## 2011-12-30 DIAGNOSIS — R3129 Other microscopic hematuria: Secondary | ICD-10-CM

## 2011-12-31 LAB — URINALYSIS W MICROSCOPIC + REFLEX CULTURE
Bilirubin Urine: NEGATIVE
Casts: NONE SEEN
Glucose, UA: NEGATIVE mg/dL
Leukocytes, UA: NEGATIVE
Protein, ur: NEGATIVE mg/dL
pH: 5 (ref 5.0–8.0)

## 2012-02-07 ENCOUNTER — Ambulatory Visit: Payer: Managed Care, Other (non HMO) | Admitting: Gynecology

## 2012-02-09 ENCOUNTER — Ambulatory Visit (INDEPENDENT_AMBULATORY_CARE_PROVIDER_SITE_OTHER): Payer: Managed Care, Other (non HMO) | Admitting: Gynecology

## 2012-02-09 ENCOUNTER — Encounter: Payer: Self-pay | Admitting: Gynecology

## 2012-02-09 VITALS — BP 122/84

## 2012-02-09 DIAGNOSIS — N83209 Unspecified ovarian cyst, unspecified side: Secondary | ICD-10-CM

## 2012-02-09 DIAGNOSIS — R102 Pelvic and perineal pain: Secondary | ICD-10-CM

## 2012-02-09 DIAGNOSIS — N83202 Unspecified ovarian cyst, left side: Secondary | ICD-10-CM | POA: Insufficient documentation

## 2012-02-09 DIAGNOSIS — N949 Unspecified condition associated with female genital organs and menstrual cycle: Secondary | ICD-10-CM

## 2012-02-09 DIAGNOSIS — T8339XA Other mechanical complication of intrauterine contraceptive device, initial encounter: Secondary | ICD-10-CM

## 2012-02-09 MED ORDER — IBUPROFEN 800 MG PO TABS: 800.0000 mg | ORAL_TABLET | Freq: Three times a day (TID) | ORAL | Status: DC | PRN

## 2012-02-09 NOTE — Progress Notes (Signed)
Patient presented to the office today requesting her have IUD string trimmed and also past 2 weeks on and off with slight discomfort in the right lower quadrant. See previous note dated 12/20/2011 for further details. Because of similar discomfort and pressure during intercourse she had an ultrasound with the following findings:  The ultrasound demonstrated uterus to measure 9.0 x 4.9 x 4.4 cm with endometrial stripe 4.7 millimeter. IUD was seen in the normal position. Uterus homogeneous. Right ovary follicle 27 x 23 mm echo-free. Left ovary thinwall echo-free avascular cyst measuring 4.7 x 4.0 x 3.6 cm average size 4.1 cm negative fluid in the cul-de-sac.  She was given Depo-Provera 150 mg IM to suppress the ovarian cyst and she will return back to the office in 4 months for followup ultrasound as well as for her annual exam. She has only been approximately 6 weeks and she had her Depo-Provera.  Exam: Abdomen soft nontender no rebound or guarding Pelvic: Bartholin urethra Skene was within normal limits Vagina: No lesions or discharge Cervix IUD string seen string trimmed Uterus: Anteverted normal size shape and consistency Adnexa: No palpable masses slight tenderness on the left greater than right but no rebound or guarding or any masses. Rectal: Not done  Assessment/plan: IUD string trimmed per patient's request. History of right echo free avascular cyst who had received Depo-Provera 150 mg IM 6 weeks ago. Patient was reassured will move up her ultrasound to next month and she'll be given a prescription of Motrin 800 mg to take 1 by mouth 3 times a day when necessary. Recent Pap smear normal.

## 2012-02-09 NOTE — Patient Instructions (Signed)
Ovarian Cyst The ovaries are small organs that are on each side of the uterus. The ovaries are the organs that produce the female hormones, estrogen and progesterone. An ovarian cyst is a sac filled with fluid that can vary in its size. It is normal for a small cyst to form in women who are in the childbearing age and who have menstrual periods. This type of cyst is called a follicle cyst that becomes an ovulation cyst (corpus luteum cyst) after it produces the women's egg. It later goes away on its own if the woman does not become pregnant. There are other kinds of ovarian cysts that may cause problems and may need to be treated. The most serious problem is a cyst with cancer. It should be noted that menopausal women who have an ovarian cyst are at a higher risk of it being a cancer cyst. They should be evaluated very quickly, thoroughly and followed closely. This is especially true in menopausal women because of the high rate of ovarian cancer in women in menopause. CAUSES AND TYPES OF OVARIAN CYSTS:  FUNCTIONAL CYST: The follicle/corpus luteum cyst is a functional cyst that occurs every month during ovulation with the menstrual cycle. They go away with the next menstrual cycle if the woman does not get pregnant. Usually, there are no symptoms with a functional cyst.  ENDOMETRIOMA CYST: This cyst develops from the lining of the uterus tissue. This cyst gets in or on the ovary. It grows every month from the bleeding during the menstrual period. It is also called a "chocolate cyst" because it becomes filled with blood that turns brown. This cyst can cause pain in the lower abdomen during intercourse and with your menstrual period.  CYSTADENOMA CYST: This cyst develops from the cells on the outside of the ovary. They usually are not cancerous. They can get very big and cause lower abdomen pain and pain with intercourse. This type of cyst can twist on itself, cut off its blood supply and cause severe pain. It  also can easily rupture and cause a lot of pain.  DERMOID CYST: This type of cyst is sometimes found in both ovaries. They are found to have different kinds of body tissue in the cyst. The tissue includes skin, teeth, hair, and/or cartilage. They usually do not have symptoms unless they get very big. Dermoid cysts are rarely cancerous.  POLYCYSTIC OVARY: This is a rare condition with hormone problems that produces many small cysts on both ovaries. The cysts are follicle-like cysts that never produce an egg and become a corpus luteum. It can cause an increase in body weight, infertility, acne, increase in body and facial hair and lack of menstrual periods or rare menstrual periods. Many women with this problem develop type 2 diabetes. The exact cause of this problem is unknown. A polycystic ovary is rarely cancerous.  THECA LUTEIN CYST: Occurs when too much hormone (human chorionic gonadotropin) is produced and over-stimulates the ovaries to produce an egg. They are frequently seen when doctors stimulate the ovaries for invitro-fertilization (test tube babies).  LUTEOMA CYST: This cyst is seen during pregnancy. Rarely it can cause an obstruction to the birth canal during labor and delivery. They usually go away after delivery. SYMPTOMS   Pelvic pain or pressure.  Pain during sexual intercourse.  Increasing girth (swelling) of the abdomen.  Abnormal menstrual periods.  Increasing pain with menstrual periods.  You stop having menstrual periods and you are not pregnant. DIAGNOSIS  The diagnosis can   be made during:  Routine or annual pelvic examination (common).  Ultrasound.  X-ray of the pelvis.  CT Scan.  MRI.  Blood tests. TREATMENT   Treatment may only be to follow the cyst monthly for 2 to 3 months with your caregiver. Many go away on their own, especially functional cysts.  May be aspirated (drained) with a long needle with ultrasound, or by laparoscopy (inserting a tube into  the pelvis through a small incision).  The whole cyst can be removed by laparoscopy.  Sometimes the cyst may need to be removed through an incision in the lower abdomen.  Hormone treatment is sometimes used to help dissolve certain cysts.  Birth control pills are sometimes used to help dissolve certain cysts. HOME CARE INSTRUCTIONS  Follow your caregiver's advice regarding:  Medicine.  Follow up visits to evaluate and treat the cyst.  You may need to come back or make an appointment with another caregiver, to find the exact cause of your cyst, if your caregiver is not a gynecologist.  Get your yearly and recommended pelvic examinations and Pap tests.  Let your caregiver know if you have had an ovarian cyst in the past. SEEK MEDICAL CARE IF:   Your periods are late, irregular, they stop, or are painful.  Your stomach (abdomen) or pelvic pain does not go away.  Your stomach becomes larger or swollen.  You have pressure on your bladder or trouble emptying your bladder completely.  You have painful sexual intercourse.  You have feelings of fullness, pressure, or discomfort in your stomach.  You lose weight for no apparent reason.  You feel generally ill.  You become constipated.  You lose your appetite.  You develop acne.  You have an increase in body and facial hair.  You are gaining weight, without changing your exercise and eating habits.  You think you are pregnant. SEEK IMMEDIATE MEDICAL CARE IF:   You have increasing abdominal pain.  You feel sick to your stomach (nausea) and/or vomit.  You develop a fever that comes on suddenly.  You develop abdominal pain during a bowel movement.  Your menstrual periods become heavier than usual. Document Released: 03/07/2005 Document Revised: 05/30/2011 Document Reviewed: 01/08/2009 Exit[employer] Patient Information 2013 [employer].  

## 2012-03-23 ENCOUNTER — Encounter: Payer: Self-pay | Admitting: Gynecology

## 2012-03-23 ENCOUNTER — Ambulatory Visit (INDEPENDENT_AMBULATORY_CARE_PROVIDER_SITE_OTHER): Payer: Managed Care, Other (non HMO) | Admitting: Gynecology

## 2012-03-23 ENCOUNTER — Ambulatory Visit (INDEPENDENT_AMBULATORY_CARE_PROVIDER_SITE_OTHER): Payer: Managed Care, Other (non HMO)

## 2012-03-23 ENCOUNTER — Other Ambulatory Visit: Payer: Self-pay | Admitting: *Deleted

## 2012-03-23 DIAGNOSIS — N83209 Unspecified ovarian cyst, unspecified side: Secondary | ICD-10-CM

## 2012-03-23 DIAGNOSIS — Z8742 Personal history of other diseases of the female genital tract: Secondary | ICD-10-CM

## 2012-03-23 DIAGNOSIS — R1032 Left lower quadrant pain: Secondary | ICD-10-CM

## 2012-03-23 DIAGNOSIS — Z30431 Encounter for routine checking of intrauterine contraceptive device: Secondary | ICD-10-CM

## 2012-03-23 NOTE — Progress Notes (Signed)
Patient presented to the office today for followup ultrasound. She was seen in the office 02/09/2012 requested that her IUD string be trimmed and also that she been having right lower quadrant discomfort so an ultrasound had been ordered. The ultrasound demonstrated the following:  The ultrasound demonstrated uterus to measure 9.0 x 4.9 x 4.4 cm with endometrial stripe 4.7 millimeter. IUD was seen in the normal position. Uterus homogeneous. Right ovary follicle 27 x 23 mm echo-free. Left ovary thinwall echo-free avascular cyst measuring 4.7 x 4.0 x 3.6 cm average size 4.1 cm negative fluid in the cul-de-sac.  She was given Depo-Provera 150 mg IM to suppress the ovarian cyst and was to return to the office for followup ultrasound and this is the reason for her office visit today. She no longer is has any discomfort and is having no menses at all. Results of today's ultrasound as follows:  Uterus measured 9.1 x 5.8 x 4.3 cm with endometrial stripe of 3.1 mm. Normal-appearing uterus and ovaries. A small left ovarian follicle seen measuring 22 mm. IUD was seen in the endometrial cavity no free fluid seen.  Patient scheduled to return back in April for annual gynecological examination or when necessary.

## 2012-06-01 ENCOUNTER — Encounter: Payer: Managed Care, Other (non HMO) | Admitting: Gynecology

## 2012-06-12 ENCOUNTER — Ambulatory Visit (INDEPENDENT_AMBULATORY_CARE_PROVIDER_SITE_OTHER): Payer: Managed Care, Other (non HMO) | Admitting: Gynecology

## 2012-06-12 ENCOUNTER — Encounter: Payer: Self-pay | Admitting: Gynecology

## 2012-06-12 VITALS — BP 110/74

## 2012-06-12 DIAGNOSIS — N949 Unspecified condition associated with female genital organs and menstrual cycle: Secondary | ICD-10-CM

## 2012-06-12 DIAGNOSIS — R1013 Epigastric pain: Secondary | ICD-10-CM | POA: Insufficient documentation

## 2012-06-12 DIAGNOSIS — N83209 Unspecified ovarian cyst, unspecified side: Secondary | ICD-10-CM

## 2012-06-12 DIAGNOSIS — N938 Other specified abnormal uterine and vaginal bleeding: Secondary | ICD-10-CM | POA: Insufficient documentation

## 2012-06-12 DIAGNOSIS — R8290 Unspecified abnormal findings in urine: Secondary | ICD-10-CM

## 2012-06-12 DIAGNOSIS — R82998 Other abnormal findings in urine: Secondary | ICD-10-CM

## 2012-06-12 DIAGNOSIS — N83202 Unspecified ovarian cyst, left side: Secondary | ICD-10-CM

## 2012-06-12 LAB — URINALYSIS W MICROSCOPIC + REFLEX CULTURE
Bilirubin Urine: NEGATIVE
Glucose, UA: NEGATIVE mg/dL
Hgb urine dipstick: NEGATIVE
Leukocytes, UA: NEGATIVE
Protein, ur: NEGATIVE mg/dL
RBC / HPF: NONE SEEN RBC/hpf (ref <3)
Urobilinogen, UA: 0.2 mg/dL (ref 0.0–1.0)
WBC, UA: NONE SEEN WBC/hpf (ref <3)

## 2012-06-12 LAB — PREGNANCY, URINE: Preg Test, Ur: NEGATIVE

## 2012-06-12 NOTE — Patient Instructions (Addendum)
Diagnostic Laparoscopy  Laparoscopy is a surgical procedure. It is used to diagnose and treat diseases inside the belly(abdomen). It is usually a brief, common, and relatively simple procedure. The laparoscopeis a thin, lighted, pencil-sized instrument. It is like a telescope. It is inserted into your abdomen through a small cut (incision). Your caregiver can look at the organs inside your body through this instrument. He or she can see if there is anything abnormal.  Laparoscopy can be done either in a hospital or outpatient clinic. You may be given a mild sedative to help you relax before the procedure. Once in the operating room, you will be given a drug to make you sleep (general anesthesia). Laparoscopy usually lasts less than 1 hour. After the procedure, you will be monitored in a recovery area until you are stable and doing well. Once you are home, it will take 2 to 3 days to fully recover.  RISKS AND COMPLICATIONS   Laparoscopy has relatively few risks. Your caregiver will discuss the risks with you before the procedure.  Some problems that can occur include:  · Infection.  · Bleeding.  · Damage to other organs.  · Anesthetic side effects.  PROCEDURE  Once you receive anesthesia, your surgeon inflates the abdomen with a harmless gas (carbon dioxide). This makes the organs easier to see. The laparoscope is inserted into the abdomen through a small incision. This allows your surgeon to see into the abdomen. Other small instruments are also inserted into the abdomen through other small openings. Many surgeons attach a video camera to the laparoscope to enlarge the view.  During a diagnostic laparoscopy, the surgeon may be looking for inflammation, infection, or cancer. Your surgeon may take tissue samples(biopsies). The samples are sent to a specialist in looking at cells and tissue samples (pathologist). The pathologist examines them under a microscope. Biopsies can help to diagnose or confirm a  disease.  AFTER THE PROCEDURE   · The gas is released from inside the abdomen.  · The incisions are closed with stitches (sutures). Because these incisions are small (usually less than 1/2 inch), there is usually minimal discomfort after the procedure. There may be some mild discomfort in the throat. This is from the tube placed in the throat while you were sleeping. You may have some mild abdominal discomfort. There may also be discomfort from the instrument placement incisions in the abdomen.  · The recovery time is shortened as long as there are no complications.  · You will rest in a recovery room until stable and doing well. As long as there are no complications, you may be allowed to go home.  FINDING OUT THE RESULTS OF YOUR TEST  Not all test results are available during your visit. If your test results are not back during the visit, make an appointment with your caregiver to find out the results. Do not assume everything is normal if you have not heard from your caregiver or the medical facility. It is important for you to follow up on all of your test results.  HOME CARE INSTRUCTIONS   · Take all medicines as directed.  · Only take over-the-counter or prescription medicines for pain, discomfort, or fever as directed by your caregiver.  · Resume daily activities as directed.  · Showers are preferred over baths.  · You may resume sexual activities in 1 week or as directed.  · Do not drive while taking narcotics.  SEEK MEDICAL CARE IF:   · There is increasing   abdominal pain.  · There is new pain in the shoulders (shoulder strap areas).  · You feel lightheaded or faint.  · You have the chills.  · You or your child has an oral temperature above 102° F (38.9° C).  · There is pus-like (purulent) drainage from any of the wounds.  · You are unable to pass gas or have a bowel movement.  · You feel sick to your stomach (nauseous) or throw up (vomit).  MAKE SURE YOU:   · Understand these instructions.  · Will watch  your condition.  · Will get help right away if you are not doing well or get worse.  Document Released: 06/13/2000 Document Revised: 05/30/2011 Document Reviewed: 03/07/2007  Exit[employer]® Patient Information ©2013 [employer].

## 2012-06-12 NOTE — Progress Notes (Signed)
Patient presented to the office today complaining of mid abdominal discomfort as well as spotting and occasional postcoital bleeding. She was seen in the office on 02/09/2012 requested that her IUD string be trimmed and also that she been having right lower quadrant discomfort so an ultrasound had been ordered. The ultrasound demonstrated the following:   The ultrasound demonstrated uterus to measure 9.0 x 4.9 x 4.4 cm with endometrial stripe 4.7 millimeter. IUD was seen in the normal position. Uterus homogeneous. Right ovary follicle 27 x 23 mm echo-free. Left ovary thinwall echo-free avascular cyst measuring 4.7 x 4.0 x 3.6 cm average size 4.1 cm negative fluid in the cul-de-sac.  She had been given Depo-Provera 150 mg IM and she returned to the office for followup ultrasound on January 3 with the following findings:  Uterus measured 9.1 x 5.8 x 4.3 cm with endometrial stripe of 3.1 mm. Normal-appearing uterus and ovaries. A small left ovarian follicle seen measuring 22 mm. IUD was seen in the endometrial cavity no free fluid seen.  Exam today: Abdomen soft and nontender no rebound or guarding Pelvic: Bartholin urethra Skene was within normal limits Vagina: No menstrual blood noted Cervix: IUD string not seen no lesion seen Uterus: Anteverted normal size shape and consistency Adnexa: No palpable masses or tenderness questionable loop of bowel the right adnexa but not tender Rectal exam: Not done  Urine pregnancy test negative Urinalysis negative  Assessment/plan: I believe that patient's symptoms are attributed to the Depo-Provera which should be starting to wear off since  She is completing and 90 days since her last injection. I will have her come back to the office the first week of April for her annual exam and ultrasound as well. Her vague abdominal discomfort were not associated with any type of meals. She denied nausea or vomiting fever or chills and her bowel movements and urine were  normal.

## 2012-07-04 ENCOUNTER — Encounter: Payer: Self-pay | Admitting: Gynecology

## 2012-07-04 ENCOUNTER — Ambulatory Visit (INDEPENDENT_AMBULATORY_CARE_PROVIDER_SITE_OTHER): Payer: Managed Care, Other (non HMO)

## 2012-07-04 ENCOUNTER — Ambulatory Visit (INDEPENDENT_AMBULATORY_CARE_PROVIDER_SITE_OTHER): Payer: Managed Care, Other (non HMO) | Admitting: Gynecology

## 2012-07-04 VITALS — BP 124/80

## 2012-07-04 DIAGNOSIS — D259 Leiomyoma of uterus, unspecified: Secondary | ICD-10-CM

## 2012-07-04 DIAGNOSIS — R1013 Epigastric pain: Secondary | ICD-10-CM

## 2012-07-04 DIAGNOSIS — Z975 Presence of (intrauterine) contraceptive device: Secondary | ICD-10-CM

## 2012-07-04 DIAGNOSIS — R1031 Right lower quadrant pain: Secondary | ICD-10-CM

## 2012-07-04 DIAGNOSIS — D251 Intramural leiomyoma of uterus: Secondary | ICD-10-CM

## 2012-07-04 DIAGNOSIS — N921 Excessive and frequent menstruation with irregular cycle: Secondary | ICD-10-CM

## 2012-07-04 DIAGNOSIS — N83202 Unspecified ovarian cyst, left side: Secondary | ICD-10-CM

## 2012-07-04 DIAGNOSIS — N938 Other specified abnormal uterine and vaginal bleeding: Secondary | ICD-10-CM

## 2012-07-04 DIAGNOSIS — N831 Corpus luteum cyst of ovary, unspecified side: Secondary | ICD-10-CM

## 2012-07-04 DIAGNOSIS — N930 Postcoital and contact bleeding: Secondary | ICD-10-CM

## 2012-07-04 LAB — COMPREHENSIVE METABOLIC PANEL
ALT: 14 U/L (ref 0–35)
Albumin: 4.3 g/dL (ref 3.5–5.2)
Alkaline Phosphatase: 48 U/L (ref 39–117)
Potassium: 3.8 mEq/L (ref 3.5–5.3)
Sodium: 140 mEq/L (ref 135–145)
Total Bilirubin: 0.3 mg/dL (ref 0.3–1.2)
Total Protein: 7 g/dL (ref 6.0–8.3)

## 2012-07-04 LAB — CBC WITH DIFFERENTIAL/PLATELET
Basophils Absolute: 0 10*3/uL (ref 0.0–0.1)
Basophils Relative: 0 % (ref 0–1)
Eosinophils Absolute: 0.1 10*3/uL (ref 0.0–0.7)
Eosinophils Relative: 1 % (ref 0–5)
MCH: 29.4 pg (ref 26.0–34.0)
MCHC: 33 g/dL (ref 30.0–36.0)
MCV: 89.1 fL (ref 78.0–100.0)
Neutrophils Relative %: 70 % (ref 43–77)
Platelets: 400 10*3/uL (ref 150–400)
RDW: 13.8 % (ref 11.5–15.5)

## 2012-07-04 NOTE — Progress Notes (Signed)
Patient presented to the office today forfor an ultrasound as a result of last visit whereby patient complaining of postcoital bleeding and right lower quadrant discomfort. Patient prior to that was seen on 02/09/2012 requesting their IUD be trimmed and an ultrasound because the right lower quadrant discomfort was done with the following result:  The ultrasound demonstrated uterus to measure 9.0 x 4.9 x 4.4 cm with endometrial stripe 4.7 millimeter. IUD was seen in the normal position. Uterus homogeneous. Right ovary follicle 27 x 23 mm echo-free. Left ovary thinwall echo-free avascular cyst measuring 4.7 x 4.0 x 3.6 cm average size 4.1 cm negative fluid in the cul-de-sac.   She had been given Depo-Provera 150 mg IM and she returned to the office for followup ultrasound on January 3 with the following findings:   Uterus measured 9.1 x 5.8 x 4.3 cm with endometrial stripe of 3.1 mm. Normal-appearing uterus and ovaries. A small left ovarian follicle seen measuring 22 mm. IUD was seen in the endometrial cavity no free fluid seen.  Her ultrasound today demonstrated the IUD was in the proper position small intramural myomas x2 the largest one 12 x 9 mm. Right ovary normal no cyst. Left ovary thin echo-free avascular cyst 34 x 29 x 24 mm.  Patient has informed me that most of her pain are usually midepigastric in location. The discomfort is not associated with meals. She has noted no change in her stools. She does feel bloated at times. She states that she belches a lot.  On exam she was tender in the mid epigastric region the lower abdomen was soft nontender with no rebound or guarding.  Assessment/plan: Small functional/physiological cyst of the left ovary patient was reassured. With an Allis followup with an ultrasound in 6 months. I'm concerned with the discomfort midepigastric that she has been having over the past several weeks and no tenderness on examination is that she may have underlying peptic  ulcer disease or positive H. Pylori. We'll also need to rule out cholelithiasis. Patient will be scheduled for an upper abdominal ultrasound later in the week. We'll also order a comprehensive metabolic panel, CBC as well as H. Pylori IgG and refer her to my GI colleagues for further evaluation. She is going to take Prilosec OTC.

## 2012-07-04 NOTE — Patient Instructions (Signed)
Helicobacter Pylori and Ulcer Disease An ulcer may be in your stomach (gastric ulcer) or in the first part of your small bowel, which is called the duodenum (duodenal ulcer). An ulcer is a break in the stomach or duodenum lining. The break wears down into the deeper tissue. Helicobacter pylori (H. pylori) is a type of germ (bacteria) that may cause the majority of gastric or duodenal ulcers. CAUSES   A germ (bacterium). H. pylori can weaken the protective mucous coating of the stomach and duodenum. This allows acid to get through to the sensitive lining of the stomach or duodenum and an ulcer can then form.  Certain medications.  Using substances that can bother the lining of the stomach (alcohol, tobacco or medications such as Advil or Motrin) in the presence of H.pylori infection. This can increase the chances of getting an ulcer.  Cancer (rarely). Most people infected with H. pylori do not get ulcers. It is not known how people catch H. pylori. It may be through food or water. H. pylori has been found in the saliva of some infected people. Therefore, the bacteria may also spread through mouth-to-mouth contact such as kissing. SYMPTOMS  The problems (symptoms) of ulcer disease are usually:  A burning or gnawing of the mid-upper belly (abdomen). This is often worse on an empty stomach. It may get better with food. This may be associated with feeling sick to your stomach (nausea), bloating and vomiting.  If the ulcer results in bleeding, it can cause:  Black, tarry stools.  Throwing up bright red blood.  Throwing up coffee ground looking materials. With severe bleeding, there may be loss of consciousness and shock. Besides ulcer disease, H. pylori can also cause chronic gastritis (irritation of the lining of the stomach without ulcer) or stomach acid-type discomfort. You may not have symptoms even though you have an H. pylori infection. Although this is an infection, you may not have usual  infection symptoms (such as fever). DIAGNOSIS  Ulcer disease can be diagnosed in many different ways. If you have an ulcer, it is important to know whether or not it is caused by H. Pylori. Treatment for an ulcer caused by H. pylori is different from that for an ulcer with other causes. The best way to detect H. pylori is taking tissue directly from the ulcer during an endoscopy test.   An endoscopy is an exam that uses an endoscope. This is a thin, lighted tube with a small camera on the end. It is like a flexible telescope. The patient is given a drug to make them calm (sedative). The caregiver eases the endoscope into the mouth and down the throat to the stomach and duodenum. This allows the doctor to see the lining of the esophagus, stomach and duodenum.  If an endoscopy is not needed, then H. pylori can be detected with tests of the blood, stool or even breath. TREATMENT   H. pylori peptic ulcer treatment usually involves a combination of:  Medicines that kill germs (antibiotics).  Acid suppressors.  Stomach protectors.  The use of only one medication to treat H. pylori is not recommended. The most proven treatment is a 2 week course of treatment called triple therapy. It involves taking two antibiotics to kill the bacteria and either an acid suppressor or stomach-lining shield. Two-week triple therapy reduces ulcer symptoms, kills the bacteria, and prevents ulcers from coming back in many patients.  Unfortunately, patients may find triple therapy hard to do. This is because it  involves taking as many as 20 pills a day. Also, the antibiotics used in triple therapy may cause mild side effects. These include nausea, vomiting, diarrhea, dark stools, a metallic taste in the mouth, dizziness, headache and yeast infections in women. Talk to your caregiver if you have any of these side effects. HOME CARE INSTRUCTIONS   Take your medications as directed and for as long as prescribed. Contact your  caregiver if you have problems or side effects from your medications.  Continue regular work and usual activities unless told otherwise by your caregiver.  Avoid tobacco, alcohol and caffeine. Tobacco use will decrease and slow healing.  Avoid medications that are harmful. This includes aspirin and NSAIDS such as ibuprofen and naproxen.  Avoid foods that seem to aggravate or cause discomfort.  There are many over-the-counter products available to control stomach acid and other symptoms. Discuss these with your caregiver before using them. Do not  stop taking prescription medications for over-the-counter medications without talking with your caregiver.  Special diets are not usually needed.  Keep any follow-up appointments and blood tests as directed. SEEK MEDICAL CARE IF:   Your pain or other ulcer symptoms do not improve within a few days of starting treatment.  You develop diarrhea. This can be a problem related to certain treatments.  You have ongoing indigestion or heartburn even if your main ulcer symptoms are improved.  You think you have any side effects from your medications or if you do not understand how to use your medications right. SEEK IMMEDIATE MEDICAL CARE IF:  Any of the following happen:  You develop bright red, rectal bleeding.  You develop dark black, tarry stools.  You throw up (vomit) blood.  You become light-headed, weak, have fainting episodes, or become sweaty, cold and clammy.  You have severe abdominal pain not controlled by medications. Do not take pain medications unless ordered by your caregiver. MAKE SURE YOU:   Understand these instructions.  Will watch your condition.  Will get help right away if you are not doing well or get worse. Document Released: 05/28/2003 Document Revised: 05/30/2011 Document Reviewed: 10/25/2007 [facility] Patient Information 2013 [place], [place].  Cholelithiasis Cholelithiasis (also called gallstones) is a form of  gallbladder disease where gallstones form in your gallbladder. The gallbladder is a non-essential organ that stores bile made in the liver, which helps digest fats. Gallstones begin as small crystals and slowly grow into stones. Gallstone pain occurs when the gallbladder spasms, and a gallstone is blocking the duct. Pain can also occur when a stone passes out of the duct.  Women are more likely to develop gallstones than men. Other factors that increase the risk of gallbladder disease are:  Having multiple pregnancies. Physicians sometimes advise removing diseased gallbladders before future pregnancies.  Obesity.  Diets heavy in fried foods and fat.  Increasing age (older than 62).  Prolonged use of medications containing female hormones.  Diabetes mellitus.  Rapid weight loss.  Family history of gallstones (heredity). SYMPTOMS  Feeling sick to your stomach (nauseous).  Abdominal pain.  Yellowing of the skin (jaundice).  Sudden pain. It may persist from several minutes to several hours.  Worsening pain with deep breathing or when jarred.  Fever.  Tenderness to the touch. In some cases, when gallstones do not move into the bile duct, people have no pain or symptoms. These are called "silent" gallstones. TREATMENT In severe cases, emergency surgery may be required. HOME CARE INSTRUCTIONS   Only take over-the-counter or prescription medicines  for pain, discomfort, or fever as directed by your caregiver.  Follow a low-fat diet until seen again. Fat causes the gallbladder to contract, which can result in pain.  Follow up as instructed. Attacks are almost always recurrent and surgery is usually required for permanent treatment. SEEK IMMEDIATE MEDICAL CARE IF:   Your pain increases and is not controlled by medications.  You have an oral temperature above 102 F (38.9 C), not controlled by medication.  You develop nausea and vomiting. MAKE SURE YOU:   Understand these  instructions.  Will watch your condition.  Will get help right away if you are not doing well or get worse. Document Released: 03/03/2005 Document Revised: 05/30/2011 Document Reviewed: 05/06/2010 [facility] Patient Information 2013 [place], [place].

## 2012-07-05 ENCOUNTER — Telehealth: Payer: Self-pay | Admitting: *Deleted

## 2012-07-05 DIAGNOSIS — R1013 Epigastric pain: Secondary | ICD-10-CM

## 2012-07-05 NOTE — Telephone Encounter (Signed)
Message copied by Aura Camps on Thu Jul 05, 2012  9:44 AM ------      Message from: Keenan Bachelor      Created: Wed Jul 04, 2012 11:54 AM                   ----- Message -----         From: Ok Edwards, MD         Sent: 07/04/2012  11:42 AM           To: Christie Nottingham, please schedule upper abdominal ultrasound( r/o cholelethiasis)  For this patient  with epigastric pains. I need a consult appointment for her with [facility] within a week after ultrasound. ------

## 2012-07-05 NOTE — Telephone Encounter (Signed)
  Pt informed appt at [facility] hospital for ultrasound on 07/10/12 @ 7:15 am, order placed, pt informed she must be npo after midnight.

## 2012-07-09 ENCOUNTER — Telehealth: Payer: Self-pay

## 2012-07-09 ENCOUNTER — Other Ambulatory Visit: Payer: Self-pay | Admitting: Gynecology

## 2012-07-09 MED ORDER — OMEPRAZOLE 20 MG PO CPDR: 20.0000 mg | DELAYED_RELEASE_CAPSULE | Freq: Two times a day (BID) | ORAL | Status: DC

## 2012-07-09 MED ORDER — CLARITHROMYCIN 500 MG PO TABS: 500.0000 mg | ORAL_TABLET | Freq: Two times a day (BID) | ORAL | Status: DC

## 2012-07-09 MED ORDER — AMOXICILLIN 500 MG PO CAPS: 1000.0000 mg | ORAL_CAPSULE | Freq: Two times a day (BID) | ORAL | Status: DC

## 2012-07-09 NOTE — Telephone Encounter (Signed)
Refrain from spicy and citrus foods, red wine, caffeine containing products and NSAIDS

## 2012-07-09 NOTE — Telephone Encounter (Signed)
Message copied by Keenan Bachelor on Mon Jul 09, 2012 11:37 AM ------      Message from: Ok Edwards      Created: Fri Jul 06, 2012  7:54 PM       Please inform patient she has a stomach infection we talked about: H. Pylori. Please call in the following prescription (she needs to take for 2 weeks):            Prilosec 20 mg BID       Amoxacillin 1 gram BID      Clarithromycin 500 mg BID            Make sure she follows up with the previously scheduled upper abdominal ultrasound and with [facility] a week after ultrasound.            After she finishes the two weeks of the medication prescribed above she needs to begin taking one iron tablet daily for her mild anemia. ------

## 2012-07-09 NOTE — Telephone Encounter (Signed)
Patient was informed of below.  Patient asked is there any revisions/restrictions she should be making to her diet in light of this stomach infection?

## 2012-07-10 ENCOUNTER — Ambulatory Visit (HOSPITAL_COMMUNITY)
Admission: RE | Admit: 2012-07-10 | Discharge: 2012-07-10 | Disposition: A | Discharge status: Home / Self Care | Payer: Managed Care, Other (non HMO) | Source: Ambulatory Visit | Attending: Gynecology | Admitting: Gynecology

## 2012-07-10 DIAGNOSIS — K829 Disease of gallbladder, unspecified: Secondary | ICD-10-CM | POA: Insufficient documentation

## 2012-07-10 DIAGNOSIS — R1013 Epigastric pain: Secondary | ICD-10-CM | POA: Insufficient documentation

## 2012-07-10 NOTE — Telephone Encounter (Signed)
Patient informed. Also, I let her know abd u/s normal.

## 2012-07-16 ENCOUNTER — Encounter: Payer: Managed Care, Other (non HMO) | Admitting: Gynecology

## 2012-07-18 ENCOUNTER — Encounter: Payer: Self-pay | Admitting: Gynecology

## 2012-08-27 ENCOUNTER — Encounter: Payer: Managed Care, Other (non HMO) | Admitting: Gynecology

## 2012-11-28 ENCOUNTER — Ambulatory Visit: Payer: Managed Care, Other (non HMO) | Admitting: Family

## 2012-11-28 ENCOUNTER — Encounter: Payer: Self-pay | Admitting: Family

## 2012-11-28 ENCOUNTER — Ambulatory Visit (HOSPITAL_BASED_OUTPATIENT_CLINIC_OR_DEPARTMENT_OTHER)
Admission: RE | Admit: 2012-11-28 | Discharge: 2012-11-28 | Disposition: A | Discharge status: Home / Self Care | Payer: 59 | Source: Ambulatory Visit | Attending: Family | Admitting: Family

## 2012-11-28 ENCOUNTER — Ambulatory Visit (INDEPENDENT_AMBULATORY_CARE_PROVIDER_SITE_OTHER): Payer: 59 | Admitting: Family

## 2012-11-28 VITALS — BP 120/79 | HR 67 | Temp 98.3°F | Resp 16 | Ht 59.5 in | Wt 152.0 lb

## 2012-11-28 DIAGNOSIS — S20211A Contusion of right front wall of thorax, initial encounter: Secondary | ICD-10-CM

## 2012-11-28 DIAGNOSIS — R0789 Other chest pain: Secondary | ICD-10-CM

## 2012-11-28 DIAGNOSIS — Z23 Encounter for immunization: Secondary | ICD-10-CM

## 2012-11-28 DIAGNOSIS — W19XXXA Unspecified fall, initial encounter: Secondary | ICD-10-CM | POA: Insufficient documentation

## 2012-11-28 DIAGNOSIS — R079 Chest pain, unspecified: Secondary | ICD-10-CM | POA: Insufficient documentation

## 2012-11-28 DIAGNOSIS — R071 Chest pain on breathing: Secondary | ICD-10-CM

## 2012-11-28 DIAGNOSIS — S298XXA Other specified injuries of thorax, initial encounter: Secondary | ICD-10-CM | POA: Insufficient documentation

## 2012-11-28 DIAGNOSIS — R0602 Shortness of breath: Secondary | ICD-10-CM | POA: Insufficient documentation

## 2012-11-28 DIAGNOSIS — S20219A Contusion of unspecified front wall of thorax, initial encounter: Secondary | ICD-10-CM

## 2012-11-28 NOTE — Progress Notes (Signed)
Subjective:    Patient ID: Peggy Green, female    DOB: 1975/07/28, 37 y.o.   MRN: [id number]  HPI  Pt new to establish care. She presents today with chief complaint of right sided breast pain that radiates through to her back x 3 weeks after falling and hitting her chest on her footboard. She was reaching up to clean and slipped.  Initially had a bruise.  Then she developed pain.  She continues have right anterior chest pain which is worsened by a deep breath.  Pain is associated with  radiates to her right underarm    Denies depression hx though there is a notation  in her chart.   She sees Dr. Lily Peer for GYN.      Review of Systems  Constitutional: Negative for unexpected weight change.  HENT: Negative for hearing loss and congestion.   Eyes:       Wears glasses.    Respiratory: Negative for cough.   Cardiovascular: Negative for leg swelling.  Gastrointestinal: Negative for nausea, vomiting and diarrhea.  Genitourinary: Negative for dysuria.  Musculoskeletal:       See HPI  Skin: Negative for rash.  Neurological: Negative for headaches.  Hematological: Negative for adenopathy.  Psychiatric/Behavioral:       Denies anxiety   Past Medical History  Diagnosis Date  . Cervical dysplasia   . History of Helicobacter pylori infection     treated 2014    History   Social History  . Marital Status: Married    Spouse Name: N/A    Number of Children: N/A  . Years of Education: N/A   Occupational History  . Not on file.   Social History Main Topics  . Smoking status: Never Smoker   . Smokeless tobacco: Never Used  . Alcohol Use: Yes     Comment: SOCIAL  . Drug Use: No  . Sexual Activity: Yes   Other Topics Concern  . Not on file   Social History Narrative   Lives with husband and 2 children (ages 54 and 69)   From [place] originally   She stays home   Completed 2 yrs of college   Enjoys staying involved with school    Past Surgical History  Procedure  Laterality Date  . Leep  06/14/2011    Procedure: LOOP ELECTROSURGICAL EXCISION PROCEDURE (LEEP);  Surgeon: Ok Edwards, MD;  Location: [facility];  Service: Gynecology;  Laterality: N/A;    Family History  Problem Relation Age of Onset  . Cancer Maternal Grandfather     STOMACH  . Thyroid disease Mother   . Hyperlipidemia Mother   . Hypertension Mother     No Known Allergies  Current Outpatient Prescriptions on File Prior to Visit  Medication Sig Dispense Refill  . phentermine 37.5 MG capsule Take 37.5 mg by mouth every morning.       No current facility-administered medications on file prior to visit.    BP 120/79  Pulse 67  Temp(Src) 98.3 F (36.8 C) (Oral)  Resp 16  Ht 4' 11.5" (1.511 m)  Wt 152 lb 0.6 oz (68.965 kg)  BMI 30.21 kg/m2  SpO2 99%  LMP 11/01/2012       Objective:   Physical Exam  Constitutional: She is oriented to person, place, and time. She appears well-developed and well-nourished. No distress.  HENT:  Head: Normocephalic and atraumatic.  Cardiovascular: Normal rate and regular rhythm.   No murmur heard. Pulmonary/Chest: Effort normal and breath  sounds normal. No respiratory distress. She has no wheezes. She has no rales. She exhibits no tenderness.  Musculoskeletal: She exhibits no edema.  Right anterior chest wall tenderness to palpation.   Lymphadenopathy:    She has no cervical adenopathy.  Neurological: She is alert and oriented to person, place, and time.  Skin: Skin is warm and dry.  Psychiatric: She has a normal mood and affect. Her behavior is normal. Judgment and thought content normal.          Assessment & Plan:

## 2012-11-28 NOTE — Patient Instructions (Addendum)
Please complete x ray on the first floor. Schedule fasting physical exam at the front desk. Welcome to [facility]!

## 2012-11-29 ENCOUNTER — Telehealth: Payer: Self-pay | Admitting: Family

## 2012-11-29 NOTE — Telephone Encounter (Signed)
See result note.  

## 2012-11-29 NOTE — Telephone Encounter (Signed)
Patient is requesting x-ray results.

## 2012-12-03 DIAGNOSIS — S20219A Contusion of unspecified front wall of thorax, initial encounter: Secondary | ICD-10-CM | POA: Insufficient documentation

## 2012-12-03 NOTE — Assessment & Plan Note (Signed)
CXR and rib film are negative.  Advised pt that pt should continue to improve over the next few weeks and to use tylenol as needed for pain.

## 2012-12-11 ENCOUNTER — Encounter: Payer: Self-pay | Admitting: Family

## 2012-12-11 ENCOUNTER — Ambulatory Visit (INDEPENDENT_AMBULATORY_CARE_PROVIDER_SITE_OTHER): Payer: 59 | Admitting: Family

## 2012-12-11 VITALS — BP 120/76 | HR 69 | Temp 98.2°F | Resp 16 | Ht 59.5 in | Wt 155.0 lb

## 2012-12-11 DIAGNOSIS — Z23 Encounter for immunization: Secondary | ICD-10-CM

## 2012-12-11 DIAGNOSIS — H9190 Unspecified hearing loss, unspecified ear: Secondary | ICD-10-CM

## 2012-12-11 DIAGNOSIS — Z0000 Encounter for general adult medical examination without abnormal findings: Secondary | ICD-10-CM

## 2012-12-11 NOTE — Assessment & Plan Note (Signed)
Continue healthy diet, exercise, weight loss efforts.  She will return fasting for lab work.  Refer to audiologist for hearing testing.

## 2012-12-11 NOTE — Patient Instructions (Addendum)
You will be contacted about your referral to the hearing specialist. Please return fasting for lab work. Follow up in 1 year for physical, sooner if problems/concerns.

## 2012-12-11 NOTE — Addendum Note (Signed)
Addended by: Mervin Kung A on: 12/11/2012 01:35 PM   Modules accepted: Orders

## 2012-12-11 NOTE — Progress Notes (Signed)
Subjective:    Patient ID: Peggy Green, female    DOB: 1975/12/04, 37 y.o.   MRN: [id number]  HPI  Patient presents today for complete physical.  Immunizations: flu shot up to date. Due for tetanus Diet: reports healthy diet Exercise: exercises almost every morning.  Pap Smear:   normal pap per patient in May    Review of Systems  Constitutional: Negative for unexpected weight change.  HENT: Negative for hearing loss.        Reports "lump" when swallowing.  Eyes: Negative for visual disturbance.  Respiratory: Negative for cough.   Cardiovascular:       Still has some musculoskeletal chest pain.    Genitourinary: Negative for dysuria.       Chronic frequency of urination "my whole life."   Past Medical History  Diagnosis Date  . Cervical dysplasia   . History of Helicobacter pylori infection     treated 2014    History   Social History  . Marital Status: Married    Spouse Name: N/A    Number of Children: N/A  . Years of Education: N/A   Occupational History  . Not on file.   Social History Main Topics  . Smoking status: Never Smoker   . Smokeless tobacco: Never Used  . Alcohol Use: Yes     Comment: SOCIAL  . Drug Use: No  . Sexual Activity: Yes   Other Topics Concern  . Not on file   Social History Narrative   Lives with husband and 2 children (ages 65 and 95)   From [place] originally   She stays home   Completed 2 yrs of college   Enjoys staying involved with school    Past Surgical History  Procedure Laterality Date  . Leep  06/14/2011    Procedure: LOOP ELECTROSURGICAL EXCISION PROCEDURE (LEEP);  Surgeon: Ok Edwards, MD;  Location: [facility];  Service: Gynecology;  Laterality: N/A;    Family History  Problem Relation Age of Onset  . Cancer Maternal Grandfather     STOMACH  . Thyroid disease Mother   . Hyperlipidemia Mother   . Hypertension Mother     No Known Allergies  Current Outpatient Prescriptions on File Prior  to Visit  Medication Sig Dispense Refill  . NON FORMULARY HCG injection for weight loss once a week.      . Topiramate (TOPAMAX PO) Take 2 tablets by mouth at bedtime as needed.      . phentermine 37.5 MG capsule Take 37.5 mg by mouth every morning.       No current facility-administered medications on file prior to visit.    BP 120/76  Pulse 69  Temp(Src) 98.2 F (36.8 C) (Oral)  Resp 16  Ht 4' 11.5" (1.511 m)  Wt 155 lb (70.308 kg)  BMI 30.79 kg/m2  SpO2 99%  LMP 11/30/2012       Objective:   Physical Exam  Physical Exam  Constitutional: She is oriented to person, place, and time. She appears well-developed and well-nourished. No distress.  HENT:  Head: Normocephalic and atraumatic.  Right Ear: Tympanic membrane and ear canal normal.  Left Ear: Tympanic membrane and ear canal normal.  Mouth/Throat: Oropharynx is clear and moist.  Eyes: Pupils are equal, round, and reactive to light. No scleral icterus.  Neck: Normal range of motion. No thyromegaly present. No visible or palpable thyroid nodules are noted.  Cardiovascular: Normal rate and regular rhythm.   No murmur heard.  Pulmonary/Chest: Effort normal and breath sounds normal. No respiratory distress. He has no wheezes. She has no rales. She exhibits no tenderness.  Abdominal: Soft. Bowel sounds are normal. He exhibits no distension and no mass. There is no tenderness. There is no rebound and no guarding.  Musculoskeletal: She exhibits no edema.  Lymphadenopathy:    She has no cervical adenopathy.  Neurological: She is alert and oriented to person, place, and time. She exhibits normal muscle tone. Coordination normal.  Skin: Skin is warm and dry.  Psychiatric: She has a normal mood and affect. Her behavior is normal. Judgment and thought content normal.  Breasts: Examined lying Right: Without masses, retractions, discharge or axillary adenopathy.  Left: Without masses, retractions, discharge or axillary adenopathy.   Pelvic: deferred to GYN       Assessment & Plan:   Pt's complaint of swallowing discomfort only started after contusion, advised pt to contact us if symptoms worsen or if symptoms do not improve.        Assessment & Plan:

## 2012-12-12 LAB — HEPATIC FUNCTION PANEL
ALT: 17 U/L (ref 0–35)
AST: 13 U/L (ref 0–37)
Albumin: 4 g/dL (ref 3.5–5.2)
Total Bilirubin: 0.5 mg/dL (ref 0.3–1.2)

## 2012-12-12 LAB — BASIC METABOLIC PANEL WITH GFR
BUN: 17 mg/dL (ref 6–23)
Calcium: 9.1 mg/dL (ref 8.4–10.5)
Creat: 0.71 mg/dL (ref 0.50–1.10)
GFR, Est African American: >89 mL/min
Glucose, Bld: 84 mg/dL (ref 70–99)
Potassium: 4.5 mEq/L (ref 3.5–5.3)

## 2012-12-12 LAB — URINALYSIS, ROUTINE W REFLEX MICROSCOPIC
Glucose, UA: NEGATIVE mg/dL
Leukocytes, UA: NEGATIVE
pH: 5 (ref 5.0–8.0)

## 2012-12-12 LAB — CBC WITH DIFFERENTIAL/PLATELET
Basophils Relative: 1 % (ref 0–1)
Eosinophils Absolute: 0.1 10*3/uL (ref 0.0–0.7)
Hemoglobin: 11.7 g/dL — ABNORMAL LOW (ref 12.0–15.0)
MCH: 28.3 pg (ref 26.0–34.0)
MCHC: 32.6 g/dL (ref 30.0–36.0)
Monocytes Absolute: 0.5 10*3/uL (ref 0.1–1.0)
Monocytes Relative: 8 % (ref 3–12)
Neutrophils Relative %: 64 % (ref 43–77)
RDW: 15.2 % (ref 11.5–15.5)

## 2012-12-12 LAB — LIPID PANEL: Cholesterol: 184 mg/dL (ref 0–200)

## 2012-12-13 ENCOUNTER — Encounter: Payer: Self-pay | Admitting: Family

## 2013-11-18 ENCOUNTER — Encounter: Payer: Self-pay | Admitting: Family

## 2013-11-18 ENCOUNTER — Ambulatory Visit (INDEPENDENT_AMBULATORY_CARE_PROVIDER_SITE_OTHER): Payer: 59 | Admitting: Family

## 2013-11-18 VITALS — BP 100/60 | HR 84 | Temp 98.4°F | Resp 14 | Ht 59.5 in | Wt 171.0 lb

## 2013-11-18 DIAGNOSIS — R635 Abnormal weight gain: Secondary | ICD-10-CM

## 2013-11-18 DIAGNOSIS — Z23 Encounter for immunization: Secondary | ICD-10-CM

## 2013-11-18 DIAGNOSIS — R1013 Epigastric pain: Secondary | ICD-10-CM

## 2013-11-18 LAB — TSH: TSH: 2.18 u[IU]/mL (ref 0.35–4.50)

## 2013-11-18 NOTE — Assessment & Plan Note (Signed)
Will refer to GI for further evaluation.

## 2013-11-18 NOTE — Progress Notes (Signed)
Subjective:    Patient ID: Peggy Green, female    DOB: [birth year], 38 y.o.   MRN: [id number]  HPI  Peggy Green is a 38 yr old female who presents today with chief complaint of weight gain. She also reports that she has had pregnancy symptoms. Such as morning sickness, nausea, fragrance sensitivity, "brown spots" on her face and cravings. She reports that she has taken 4 pregnancy tests since June (all of which were negative) and has a had a period every month. She does have some epigastric pain and belching.  Reports that she continues to exercise regularly. Has had family members visiting and reports to dietary indiscretion over the summer. Reports that she did a 21 day fast with her church which concluded august 21. During this time she ate who grains and veggies (no meats). Denies depression symptoms.   Wt Readings from Last 3 Encounters:  11/18/13 171 lb (77.565 kg)  12/11/12 155 lb (70.308 kg)  11/28/12 152 lb 0.6 oz (68.965 kg)   Epigastric pain- she saw her GYN last year about this. He checked an H. Pylori IgG which was positive. He treated her for H pylori. She also had an abdominal US at that time which showed a normal gallbladder.  Despite rx for H. Pylori, she continues to have epigastric tenderness.  Review of Systems    see HPI  Past Medical History  Diagnosis Date  . Cervical dysplasia   . History of Helicobacter pylori infection     treated 2014    History   Social History  . Marital Status: Married    Spouse Name: N/A    Number of Children: N/A  . Years of Education: N/A   Occupational History  . Not on file.   Social History Main Topics  . Smoking status: Never Smoker   . Smokeless tobacco: Never Used  . Alcohol Use: Yes     Comment: SOCIAL  . Drug Use: No  . Sexual Activity: Yes   Other Topics Concern  . Not on file   Social History Narrative   Lives with husband and 2 children (ages 55 and 35)   From [place] originally   She stays home   Completed 2  yrs of college   Enjoys staying involved with school    Past Surgical History  Procedure Laterality Date  . Leep  06/14/2011    Procedure: LOOP ELECTROSURGICAL EXCISION PROCEDURE (LEEP);  Surgeon: Terrance Mass, MD;  Location: [facility];  Service: Gynecology;  Laterality: N/A;    Family History  Problem Relation Age of Onset  . Cancer Maternal Grandfather     STOMACH  . Thyroid disease Mother   . Hyperlipidemia Mother   . Hypertension Mother     No Known Allergies  No current outpatient prescriptions on file prior to visit.   No current facility-administered medications on file prior to visit.    BP 100/60  Pulse 84  Temp(Src) 98.4 F (36.9 C) (Oral)  Resp 14  Ht 4' 11.5" (1.511 m)  Wt 171 lb (77.565 kg)  BMI 33.97 kg/m2  LMP 11/01/2013    Objective:   Physical Exam  Constitutional: She is oriented to person, place, and time. She appears well-developed and well-nourished. No distress.  HENT:  Head: Normocephalic.  Cardiovascular: Normal rate and regular rhythm.   No murmur heard. Pulmonary/Chest: Effort normal and breath sounds normal. No respiratory distress. She has no wheezes. She has no rales. She exhibits no  tenderness.  Abdominal: Soft. Bowel sounds are normal. She exhibits no distension and no mass. There is tenderness in the epigastric area. There is no rebound and no guarding. No hernia.  Neurological: She is alert and oriented to person, place, and time.  Psychiatric: She has a normal mood and affect. Her behavior is normal. Judgment and thought content normal.          Assessment & Plan:  25 minutes spent with pt today >50% of this time was spent counseling pt on diet, exercise and weight loss.

## 2013-11-18 NOTE — Patient Instructions (Signed)
Please complete lab work prior to leaving. You will be contacted about your referral to GI. Download My Fitness Pal and start counting calories with goal weight loss of 1-2 pounds a week. Exercise 30 minutes five days a week.  Follow up in 3 months.

## 2013-11-18 NOTE — Assessment & Plan Note (Addendum)
Will obtain TSH. Advised pt continue regular exercise.  Start counting calories using my fitness pal.  Will also check serum hcg, though pt has a IUD and has been having periods so I doubt pregnancy.

## 2013-11-19 ENCOUNTER — Encounter: Payer: Self-pay | Admitting: Family

## 2013-11-26 ENCOUNTER — Encounter: Payer: 59 | Admitting: Family

## 2013-12-11 ENCOUNTER — Telehealth: Payer: Self-pay | Admitting: Family

## 2013-12-11 DIAGNOSIS — R635 Abnormal weight gain: Secondary | ICD-10-CM

## 2013-12-11 NOTE — Telephone Encounter (Signed)
Please let pt know that unfortunately, that lab did not get completed as planned.  Could she please come to the lab for follow up blood draw. Order has been placed.

## 2013-12-11 NOTE — Telephone Encounter (Signed)
Hcg shows Order Canceled/SLS Please Advise.

## 2013-12-11 NOTE — Telephone Encounter (Signed)
Pt was upset about having to come back in given that labs were drawn on the 11/18/13.  Given that it is beyond the 7 day window, we are not able to use the blood drawn that day.  Pt was asked to come back in to have labs drawn.  Pt stated that she would call back to schedule appointment.

## 2013-12-11 NOTE — Telephone Encounter (Signed)
Patient was in on 8-31 and had blood work and was to have a pregnancy test done from the blood work  She would like the results

## 2013-12-18 ENCOUNTER — Encounter: Payer: 59 | Admitting: Family

## 2013-12-20 ENCOUNTER — Encounter: Payer: 59 | Admitting: Family

## 2013-12-20 ENCOUNTER — Telehealth: Payer: Self-pay | Admitting: *Deleted

## 2013-12-20 NOTE — Telephone Encounter (Signed)
Noted. Do not charge no-show fee.

## 2013-12-20 NOTE — Telephone Encounter (Signed)
Pt did not show for appointment 12/20/2013 at [facility] for CPE

## 2013-12-20 NOTE — Telephone Encounter (Signed)
See below

## 2014-01-15 ENCOUNTER — Encounter: Payer: 59 | Admitting: Family

## 2014-01-20 ENCOUNTER — Encounter: Payer: Self-pay | Admitting: Family

## 2014-04-16 ENCOUNTER — Encounter: Payer: 59 | Admitting: Family Medicine

## 2014-07-16 IMAGING — US US ABDOMEN COMPLETE
1 series · 14 of 25 positions shown · non-contrast
Comparison: None.

CLINICAL DATA: Epigastric pain

COMPLETE ABDOMINAL ULTRASOUND

[Series 1: us abdomen complete · 14 of 67 slices shown]
[im 1/67]
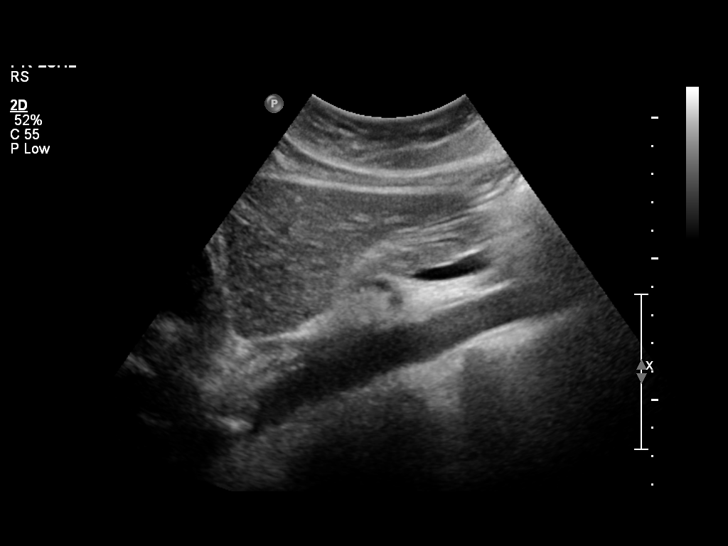
[im 6/67]
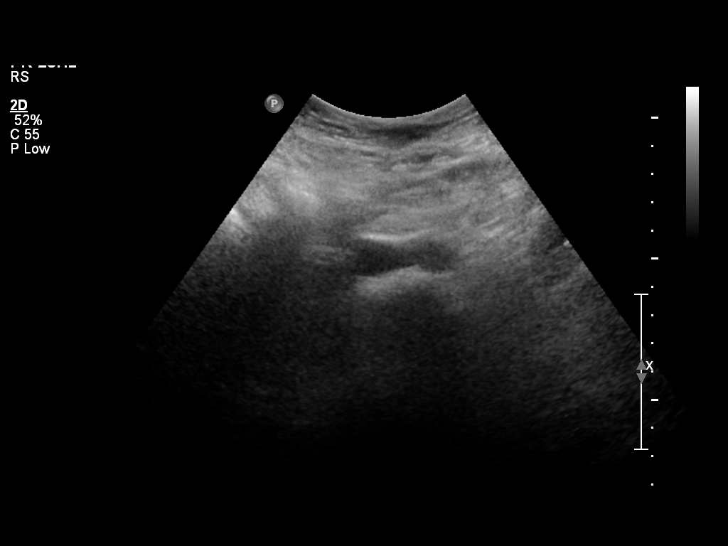
[im 12/67]
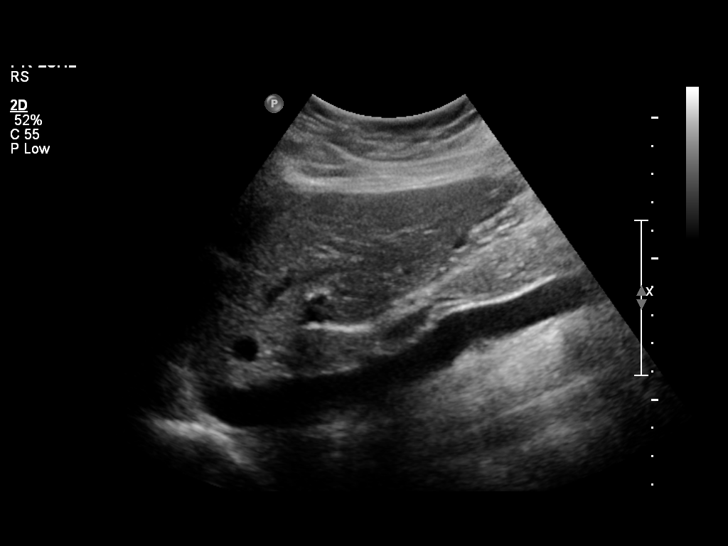
[im 17/67]
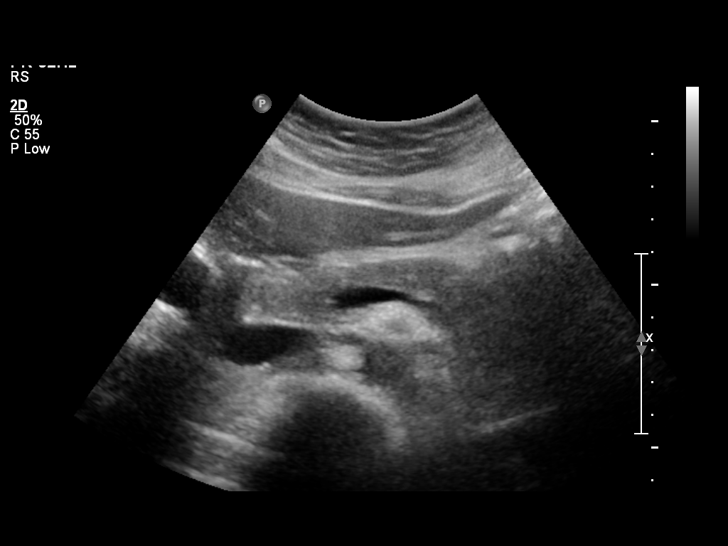
[im 23/67]
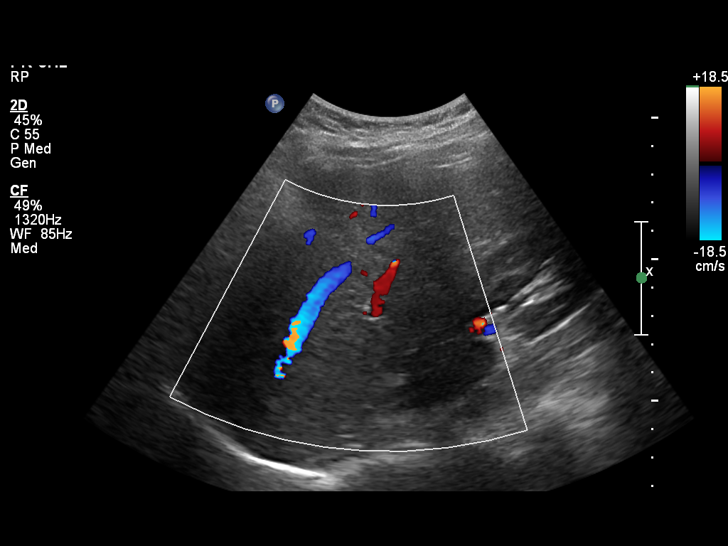
[im 25/67]
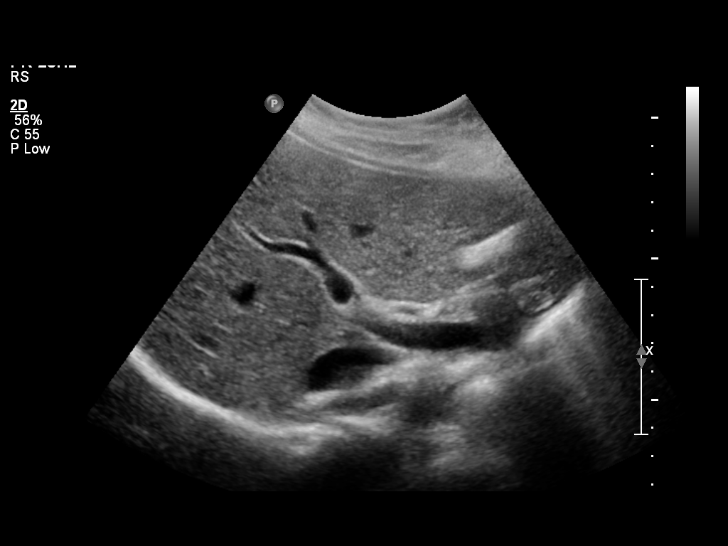
[im 31/67]
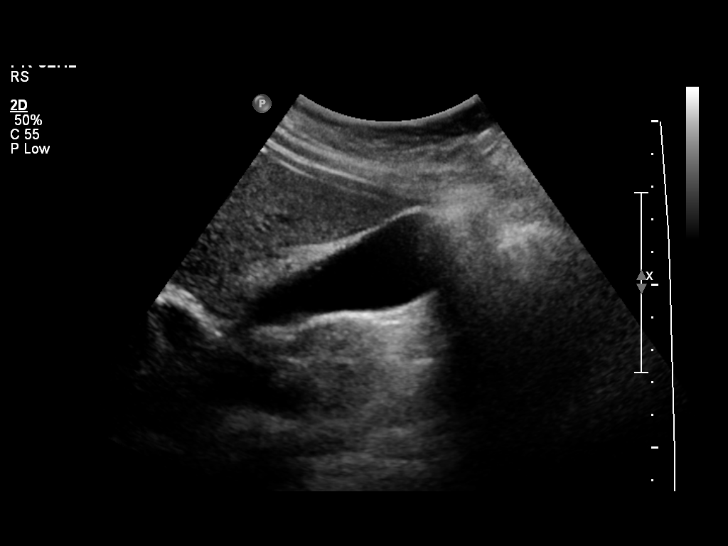
[im 36/67]
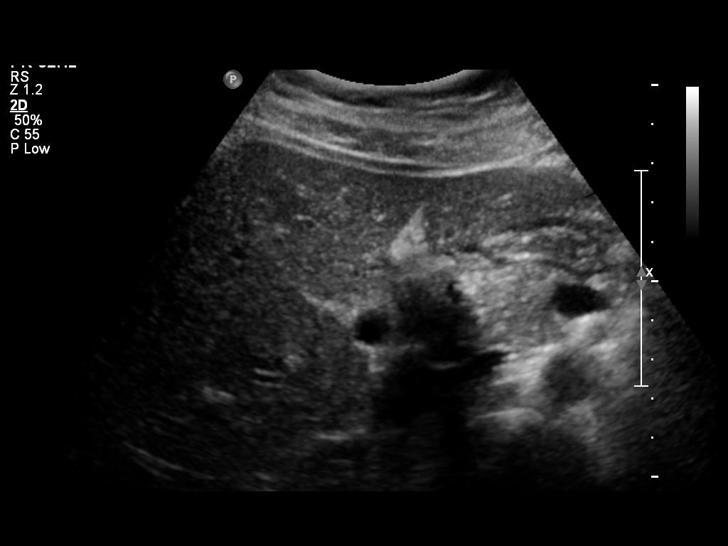
[im 42/67]
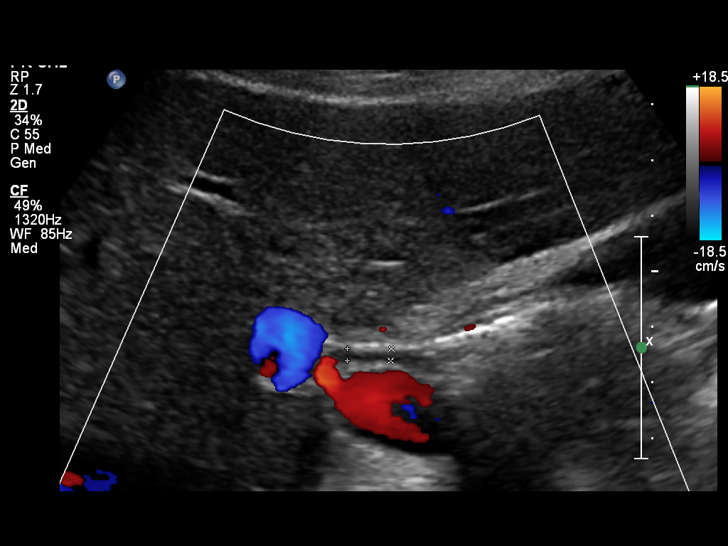
[im 45/67]
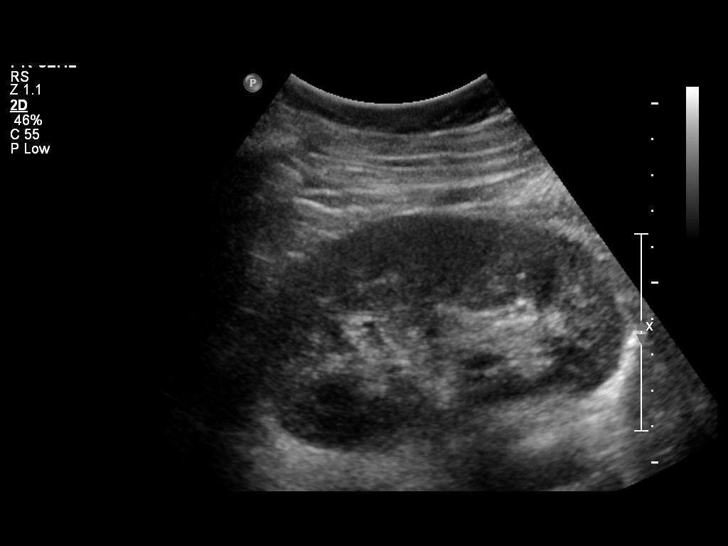
[im 50/67]
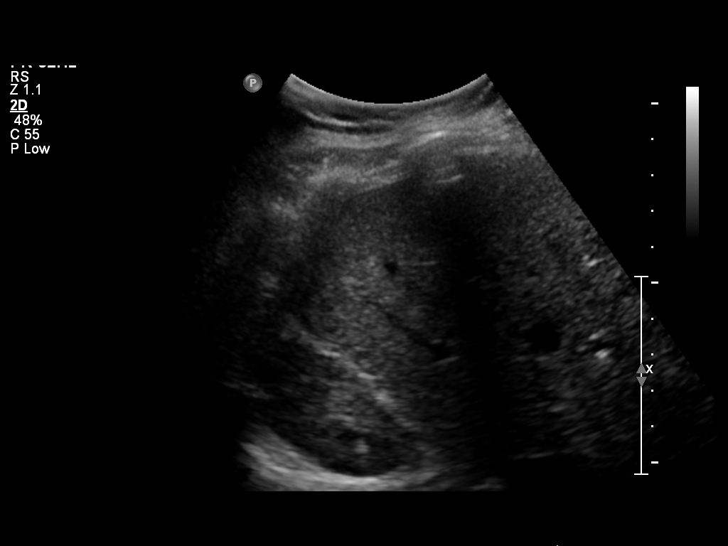
[im 56/67]
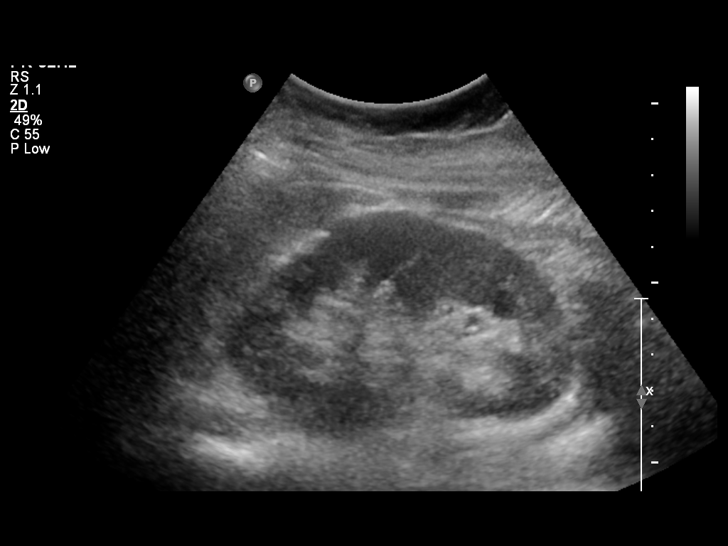
[im 61/67]
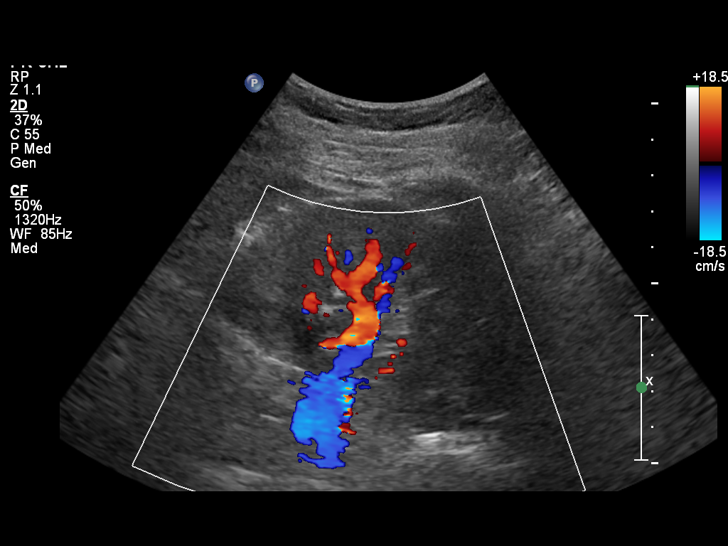
[im 67/67]
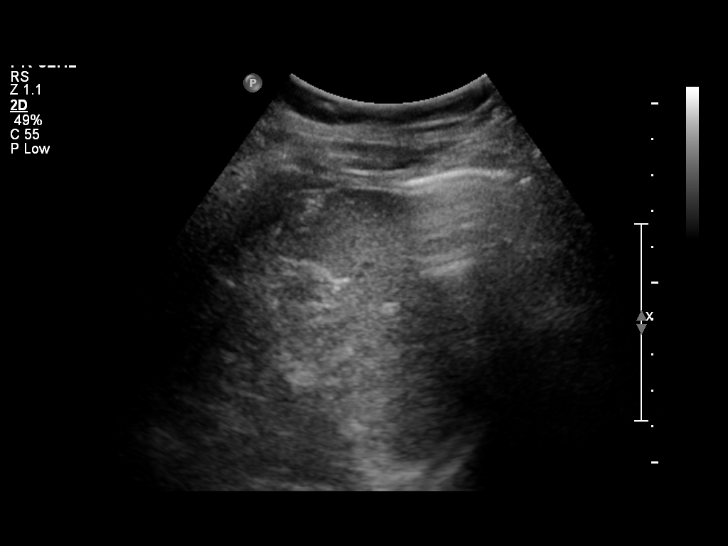

[14 of 25 positions shown; findings below may reference images not displayed]

FINDINGS: Gallbladder:  No gallstones, gallbladder wall thickening, or
pericholecystic fluid.

Common bile duct:  Normal at 2 mm

Liver:  No focal lesion identified.  Within normal limits in
parenchymal echogenicity.

IVC:  Appears normal.

Pancreas:  No focal abnormality seen.

Spleen:  Normal in size and echogenicity.

Right Kidney:  10.1cm in length.  No evidence of hydronephrosis or
stones.

Left Kidney:  9.9cm in length.  No evidence of hydronephrosis or
stones.

Abdominal aorta:  No aneurysm identified.
IMPRESSION: 1.  No acute abdominal findings by ultrasound.
2.  Normal gallbladder.

## 2014-12-04 IMAGING — CR DG RIBS W/ CHEST 3+V*R*
3 series · 3 of 3 positions shown · non-contrast
Comparison: None.

CLINICAL DATA: 37-year-old female with right upper anterior chest
pain status post fall. Shortness of Breath.

EXAM:
RIGHT RIBS AND CHEST - 3+ VIEW

[w chest pa]
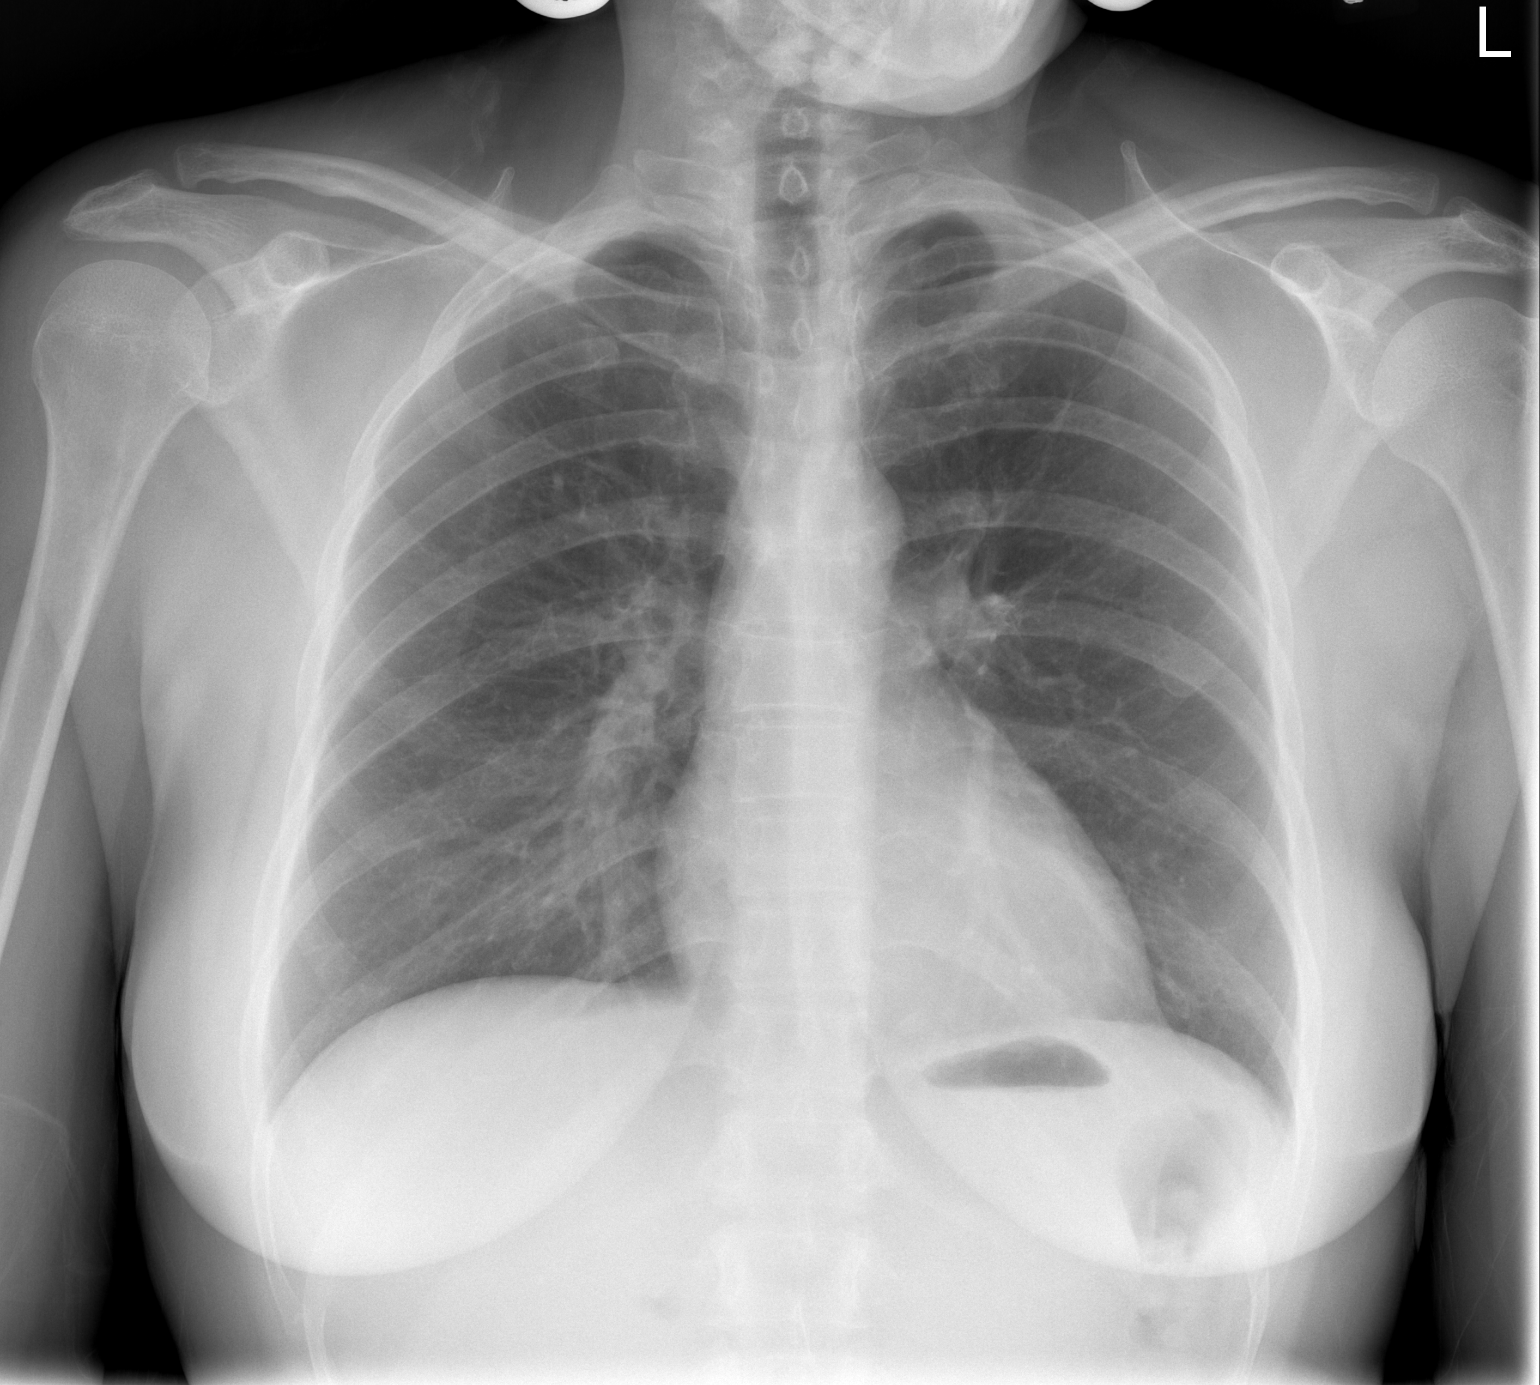

[w ribs ap/pa upper right]
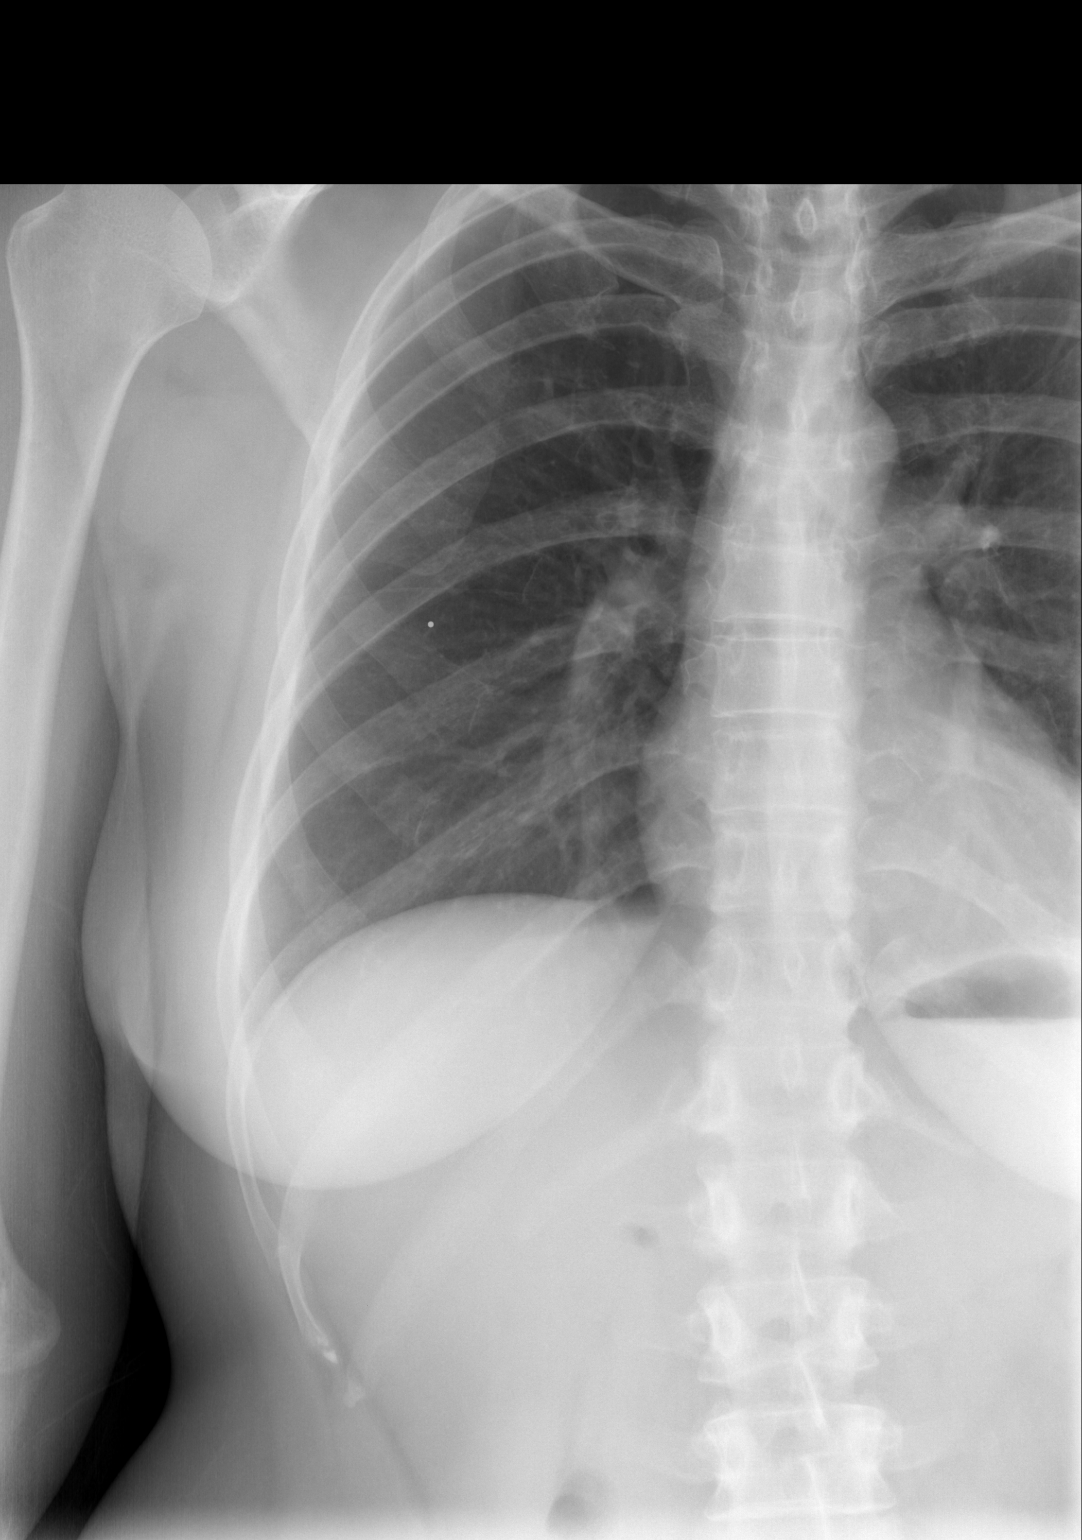

[w ribs ap/pa lower right]
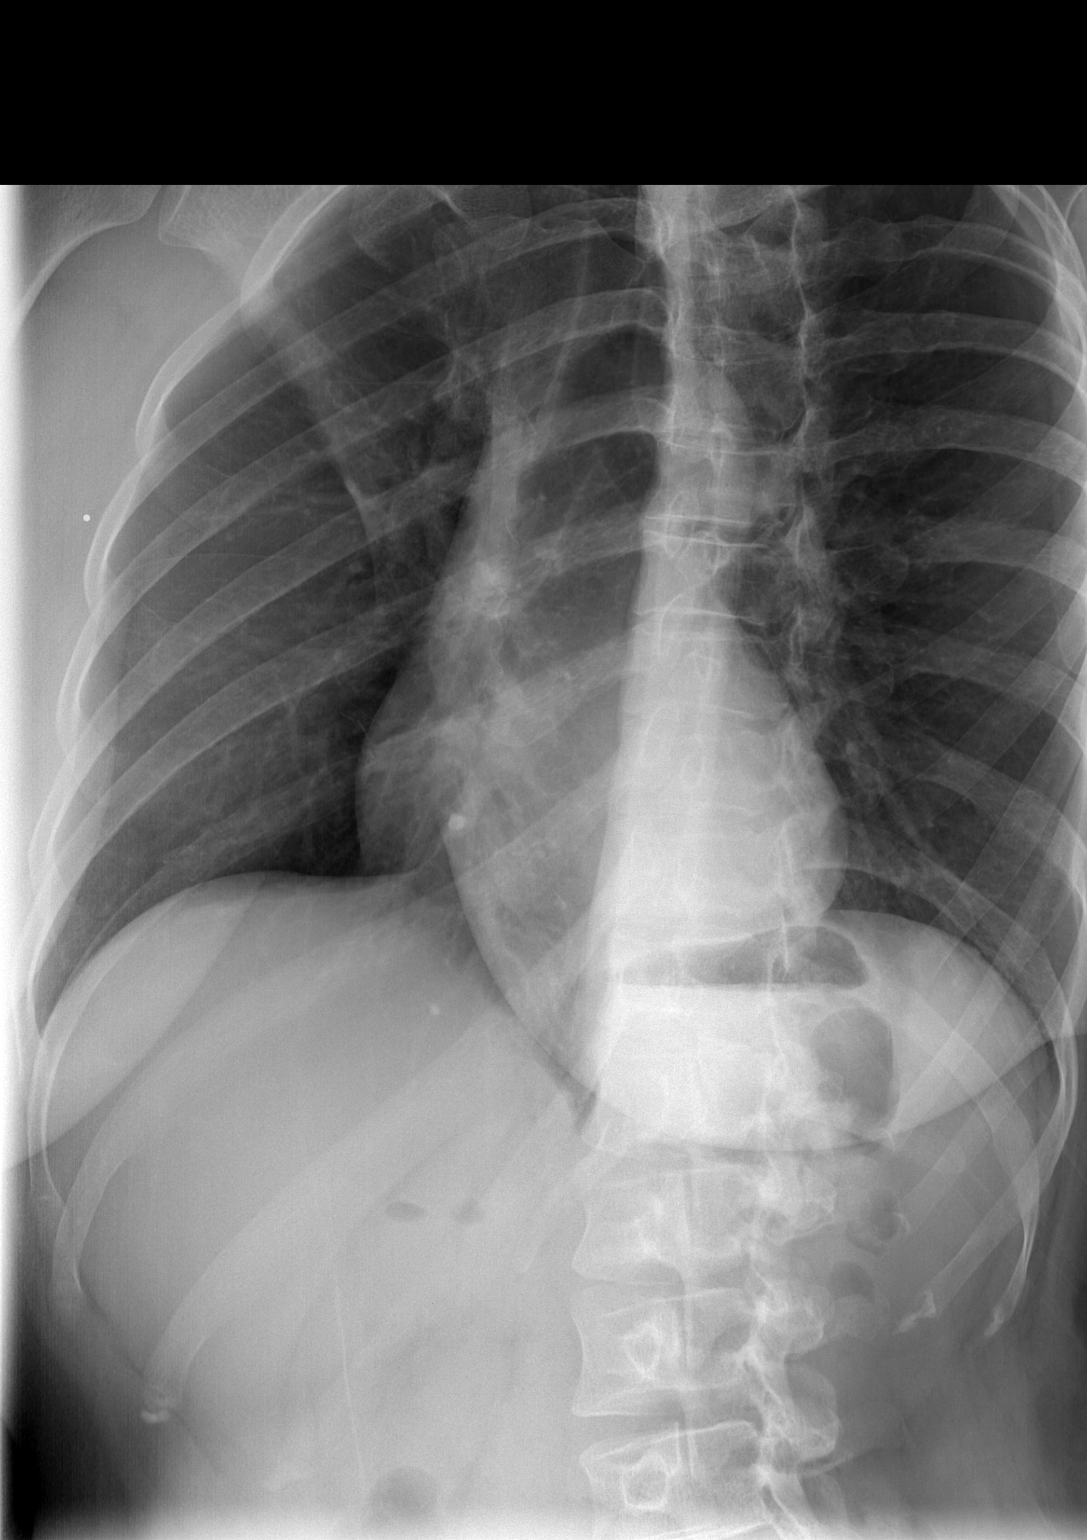

[3 of 3 positions shown; findings below may reference images not displayed]

FINDINGS: Normal lung volumes. Normal cardiac size and mediastinal contours.
Visualized tracheal air column is within normal limits. No
pneumothorax, pulmonary edema, pleural effusion or confluent
pulmonary opacity.

Marker placed at the level of the right anterior 4th rib. Bone
mineralization is within normal limits. No displaced rib fracture
identified. Hypoplastic 12th ribs incidentally noted.
IMPRESSION: No displaced right rib fracture identified. No acute cardiopulmonary
abnormality.

## 2015-11-19 ENCOUNTER — Encounter: Payer: Self-pay | Admitting: Sports Medicine

## 2015-11-25 ENCOUNTER — Encounter: Payer: Self-pay | Admitting: Sports Medicine

## 2015-11-25 ENCOUNTER — Ambulatory Visit (INDEPENDENT_AMBULATORY_CARE_PROVIDER_SITE_OTHER): Payer: 59 | Admitting: Sports Medicine

## 2015-11-25 ENCOUNTER — Ambulatory Visit (INDEPENDENT_AMBULATORY_CARE_PROVIDER_SITE_OTHER): Payer: 59

## 2015-11-25 DIAGNOSIS — M545 Low back pain, unspecified: Secondary | ICD-10-CM | POA: Insufficient documentation

## 2015-11-25 DIAGNOSIS — M4308 Spondylolysis, sacral and sacrococcygeal region: Secondary | ICD-10-CM

## 2015-11-25 DIAGNOSIS — E669 Obesity, unspecified: Secondary | ICD-10-CM

## 2015-11-25 MED ORDER — PREDNISONE 50 MG PO TABS
ORAL_TABLET | ORAL | 0 refills | Status: DC
Start: 2015-11-25 — End: 2015-12-21

## 2015-11-25 MED ORDER — PHENTERMINE HCL 37.5 MG PO TABS: ORAL_TABLET | ORAL | 0 refills | Status: DC

## 2015-11-25 MED ORDER — MELOXICAM 15 MG PO TABS: ORAL_TABLET | ORAL | 3 refills | Status: DC

## 2015-11-25 MED FILL — predniSONE 50 MG TABS: 50 | 5 days supply | Qty: 5 | Fill #0

## 2015-11-25 MED FILL — MELOXICAM 15 MG TABLET: 15 | 30 days supply | Qty: 30 | Fill #0

## 2015-11-25 MED FILL — PHENTERMINE 37.5 MG TABLET: 37.5 | 30 days supply | Qty: 30 | Fill #0

## 2015-11-25 NOTE — Assessment & Plan Note (Signed)
Suspect discogenic axial back pain. Physical therapy, x-rays, prednisone, meloxicam. She will work on weight loss and establish with one of our primary care providers here.

## 2015-11-25 NOTE — Progress Notes (Signed)
   Subjective:    I'm seeing this patient as a consultation for:  Debbrah Alar NP  CC: Low back pain  HPI: For many years this pleasant 40 year old female has had pain that she localizes in the midline of her low back.  Pain is moderate, intermittent. Radiates to the thighs but not past the knees, no foot paresthesias or leg paresthesias. No bowel or bladder dysfunction, saddle numbness, constitutional symptoms. Not worse with flexion, extension. Has never had imaging.  Past medical history, Surgical history, Family history not pertinant except as noted below, Social history, Allergies, and medications have been entered into the medical record, reviewed, and no changes needed.   Review of Systems: No headache, visual changes, nausea, vomiting, diarrhea, constipation, dizziness, abdominal pain, skin rash, fevers, chills, night sweats, weight loss, swollen lymph nodes, body aches, joint swelling, muscle aches, chest pain, shortness of breath, mood changes, visual or auditory hallucinations.   Objective:   General: Well Developed, well nourished, and in no acute distress.  Neuro/Psych: Alert and oriented x3, extra-ocular muscles intact, able to move all 4 extremities, sensation grossly intact. Skin: Warm and dry, no rashes noted.  Respiratory: Not using accessory muscles, speaking in full sentences, trachea midline.  Cardiovascular: Pulses palpable, no extremity edema. Abdomen: Does not appear distended. Back Exam:  Inspection: Unremarkable  Motion: Flexion 45 deg, Extension 45 deg, Side Bending to 45 deg bilaterally,  Rotation to 45 deg bilaterally  SLR laying: Negative  XSLR laying: Negative  Palpable tenderness: None. FABER: negative. Sensory change: Gross sensation intact to all lumbar and sacral dermatomes.  Reflexes: 2+ at both patellar tendons, 2+ at achilles tendons, Babinski's downgoing.  Strength at foot  Plantar-flexion: 5/5 Dorsi-flexion: 5/5 Eversion: 5/5 Inversion:  5/5  Leg strength  Quad: 5/5 Hamstring: 5/5 Hip flexor: 5/5 Hip abductors: 5/5  Gait unremarkable.  Impression and Recommendations:   This case required medical decision making of moderate complexity.  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 Physical therapy, x-rays, prednisone, meloxicam. She will work on weight loss and establish with one of our primary care providers here.  Obesity Referral to nutrition, starting phentermine, she will follow up in one month with one of our PCPs to further manage her weight.

## 2015-11-25 NOTE — Assessment & Plan Note (Signed)
Referral to nutrition, starting phentermine, she will follow up in one month with one of our PCPs to further manage her weight.

## 2015-12-03 ENCOUNTER — Ambulatory Visit: Payer: 59

## 2015-12-14 ENCOUNTER — Ambulatory Visit: Payer: 59 | Attending: Sports Medicine | Admitting: Physical Therapy

## 2015-12-14 DIAGNOSIS — M5441 Lumbago with sciatica, right side: Secondary | ICD-10-CM | POA: Insufficient documentation

## 2015-12-14 DIAGNOSIS — M5442 Lumbago with sciatica, left side: Secondary | ICD-10-CM | POA: Diagnosis present

## 2015-12-14 NOTE — Therapy (Addendum)
[facility] [address]  [place], [place], [address] Phone: [phone]   Fax:  [phone]  Physical Therapy Evaluation  Patient Details  Name: Peggy Green MRN: [id number] Date of Birth: February 13, 1976 Referring Provider: Aundria Mems, MD  Encounter Date: 12/14/2015      PT End of Session - 12/14/15 1106    Visit Number 1   Number of Visits 12   Date for PT Re-Evaluation 01/25/16   Authorization - Number of Visits 60   PT Start Time 1018   PT Stop Time 1102   PT Time Calculation (min) 44 min   Activity Tolerance Patient tolerated treatment well   Behavior During Therapy [facility] for tasks assessed/performed      Past Medical History:  Diagnosis Date  . Cervical dysplasia   . History of Helicobacter pylori infection    treated 2014    Past Surgical History:  Procedure Laterality Date  . LEEP  06/14/2011   Procedure: LOOP ELECTROSURGICAL EXCISION PROCEDURE (LEEP);  Surgeon: Terrance Mass, MD;  Location[facility];  Service: Gynecology;  Laterality: N/A;    There were no vitals filed for this visit.       Subjective Assessment - 12/14/15 1022    Subjective Pt reports longstanding h/o of back pain episodes typically lasting ~2 weeks without known triggers and resolving on its own. Over the past year, episodes have become more frequent, with most recent flare ~1.5 months ago. When pain flares up, pain typically midline but randomly will have pain down one or both legs.   Limitations Sitting;Standing   How long can you sit comfortably? no limitaiton currently but during a flare-up, a few minutes   How long can you stand comfortably? 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 No fracture or spondylolisthesis. No apparent arthropathy.   Patient Stated Goals "To find a rountine to prevent the  flare-ups"   Currently in Pain? No/denies            [facility] PT Assessment - 12/14/15 1018      Assessment   Medical Diagnosis B Low back pain w/o sciatica   Referring Provider Aundria Mems, MD   Onset Date/Surgical Date --  1.5 months ago   Next MD Visit 12/23/15   Prior Therapy none     Balance Screen   Has the patient fallen in the past 6 months No   Has the patient had a decrease in activity level because of a fear of falling?  No   Is the patient reluctant to leave their home because of a fear of falling?  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   Leisure Mostly sedentary     Observation/Other Assessments   Focus on Therapeutic Outcomes (FOTO)  Lumbar Spine - 57% (43% limitation); predicted 67% (33% limitation)     ROM / Strength   AROM / PROM / Strength AROM;Strength     AROM   Overall AROM  Within functional limits for  tasks performed   AROM Assessment Site Lumbar     Strength   Strength Assessment Site Hip   Right/Left Hip Right;Left   Right Hip Flexion 4+/5   Right Hip Extension 4-/5   Right Hip External Rotation  4+/5   Right Hip Internal Rotation 4/5   Right Hip ABduction 4+/5   Right Hip ADduction 4/5   Left Hip Flexion 4/5   Left Hip Extension 4-/5   Left Hip External Rotation 4+/5   Left Hip Internal Rotation 4+/5   Left Hip ABduction 4/5   Left Hip ADduction 4-/5     Flexibility   Soft Tissue Assessment /Muscle Length yes   Hamstrings WFL   Quadriceps mild/mod hip flexor & RF tightness   ITB WFL   Piriformis mildly tight on L         Today's Treatment  TherEx B KTOS piriformis stretch x30" B  Mod thomas Hip flexor stretch x30" Abdominal bracing/Pelvic tilt 10x10" Bridge + B Hip ABD isometric with blue TB 10x5" B Hooklying Alt Hip ABD/ER with blue TB 10x3" B Sidelying Hip Clam with blue TB 10x3"          PT Education - 12/14/15 1100    Education provided Yes   Education Details PT eval findings, POC and initial HEP   Person(s) Educated Patient   Methods Explanation;Demonstration;Handout   Comprehension Verbalized understanding;Returned demonstration             PT Long Term Goals - 12/14/15 1102      PT LONG TERM GOAL #1   Title Independent with HEP by 01/25/16   Status New     PT LONG TERM GOAL #2   Title B hip strength >/= 4+/5 by 01/25/16   Status New     PT LONG TERM GOAL #3   Title Pt will demonstrate good awareness of neutral spine posture and core activation to support lumbar spine by 01/25/16    Status New               Plan - 12/14/15 1102    Clinical Impression Statement Peggy Green is a 40 y/o female who presents to OP PT for acute on chronic bilateral low back pain with fluctuating radiculopathy. Pt reports longstanding h/o of LBP which typically presents for ~2 week episodes with flare-up resulting from unknown trigger and spontaneously resolving of its own after ~2 weeks, however pt reporting the frequency of these flare-ups has intensified over the past year with the most recent occurring ~1.5 months ago. Pt denies pain at time of eval or since the most recent flare-up and states intensity varies when pain present during flare-ups. Notes also has radicular pain into LE's at times when LBP present, but again no consistent pattern noted and no radiculopathy present at eval. Pain limits positional tolerance when present with sitting and static standing most affected. Assessment reveals lumbar ROM WFL with no pain or sciatica reported. Proximal LE musculature demonstrating mild to moderately limited flexibility L > R, especially in hip flexors/RF and  piriformis. Positive SLR special test noted on L. Mild/mod weakness noted in B hips with L > R (refer to above MMT). POC will focus on improving LE soft tissue pliability along with core/proximal stability training, postural training with emphasis on neutral spine alignment, LE strengthening and manual therapy/modalities PRN for pain. May consider mechanical traction if radicular symptoms present and TDN for muscular tightness and muscle spasms PRN. Given absence of pain at present, will initiate  PT with 1x/wk frequency with emphasis  on HEP progression, but will increase frequency to 2x/wk if pain becomes an issue or pt has difficulty with HEP progression.   Rehab Potential Excellent   PT Frequency 2x / week  1-2x/wk   PT Duration 6 weeks  4-6 weeks   PT Treatment/Interventions Patient/family education;Neuromuscular re-education;Therapeutic exercise;Electrical Stimulation;Moist Heat;Iontophoresis 3m/ml Dexamethasone;Traction;Taping;Manual techniques;ADLs/Self Care Home Management   PT Next Visit Plan Review initial HEP and progress as indicated; Lumbar stabilization & proximal LE strengthening; Manual therapy & modalities PRN for pain   Consulted and Agree with Plan of Care Patient      Patient will benefit from skilled therapeutic intervention in order to improve the following deficits and impairments:  Postural dysfunction, Decreased strength, Increased muscle spasms, Pain, Improper body mechanics, Decreased activity tolerance  Visit Diagnosis: Bilateral low back pain with sciatica, sciatica laterality unspecified     Problem List Patient Active Problem List   Diagnosis Date Noted  . Low back pain syndrome 11/25/2015  . Obesity 11/25/2015  . Weight gain 11/18/2013  . Routine general medical examination at a health care facility 12/11/2012  . Chest wall contusion 12/03/2012  . Midepigastric pain 07/04/2012  . Ovarian cyst, left 06/12/2012  . DUB (dysfunctional uterine bleeding)  06/12/2012  . Abdominal pain, epigastric 06/12/2012  . CIN II (cervical intraepithelial neoplasia II) 05/31/2011  . DEPRESSION 02/21/2008  . BLADDER PROLAPSE 02/21/2008  . SCIATICA 02/21/2008  . URINARY INCONTINENCE 02/21/2008    JPercival Spanish PT, MPT 12/14/2015, 3:22 PM  [facility] [place] [address]Phone: [phone]  Fax:  [phone] Name: Peggy ConleyMRN: [id number] of Birth: [birth year] PHYSICAL THERAPY DISCHARGE SUMMARY  Visits from Start of Care: 1  Current functional level related to goals / functional outcomes:   Refer to above clinical impression from eval. Pt failed to return for any further visits.   Remaining deficits:   As above.   Education / Equipment:   Initial HEP  Plan: Patient agrees to discharge.  Patient goals were not met. Patient is being discharged due to not returning since the last visit.  ?????        JPercival Spanish PT, MPT 01/11/16, 11:44 AM  [facility] [place] [address]Phone: [phone]  Fax:  [phone]

## 2015-12-18 ENCOUNTER — Ambulatory Visit: Payer: Managed Care, Other (non HMO) | Admitting: Gynecology

## 2015-12-21 ENCOUNTER — Encounter: Payer: Self-pay | Admitting: Family

## 2015-12-21 ENCOUNTER — Ambulatory Visit (INDEPENDENT_AMBULATORY_CARE_PROVIDER_SITE_OTHER): Payer: 59 | Admitting: Family

## 2015-12-21 VITALS — BP 100/61 | HR 55 | Temp 98.3°F | Resp 16 | Ht 59.0 in | Wt 183.2 lb

## 2015-12-21 DIAGNOSIS — Z0000 Encounter for general adult medical examination without abnormal findings: Secondary | ICD-10-CM | POA: Diagnosis not present

## 2015-12-21 DIAGNOSIS — E669 Obesity, unspecified: Secondary | ICD-10-CM | POA: Diagnosis not present

## 2015-12-21 DIAGNOSIS — Z8742 Personal history of other diseases of the female genital tract: Secondary | ICD-10-CM

## 2015-12-21 DIAGNOSIS — Z6837 Body mass index (BMI) 37.0-37.9, adult: Secondary | ICD-10-CM | POA: Diagnosis not present

## 2015-12-21 DIAGNOSIS — Z23 Encounter for immunization: Secondary | ICD-10-CM

## 2015-12-21 DIAGNOSIS — Z87898 Personal history of other specified conditions: Secondary | ICD-10-CM

## 2015-12-21 DIAGNOSIS — E66812 Obesity, class 2: Secondary | ICD-10-CM

## 2015-12-21 LAB — URINALYSIS, ROUTINE W REFLEX MICROSCOPIC
BILIRUBIN URINE: NEGATIVE
HGB URINE DIPSTICK: NEGATIVE
KETONES UR: NEGATIVE
LEUKOCYTES UA: NEGATIVE
NITRITE: NEGATIVE
PH: 7 (ref 5.0–8.0)
Specific Gravity, Urine: 1.02 (ref 1.000–1.030)
Total Protein, Urine: NEGATIVE
Urine Glucose: NEGATIVE
Urobilinogen, UA: 0.2 (ref 0.0–1.0)

## 2015-12-21 LAB — CBC WITH DIFFERENTIAL/PLATELET
Basophils Absolute: 0.1 10*3/uL (ref 0.0–0.1)
Basophils Relative: 0.7 % (ref 0.0–3.0)
EOS PCT: 1.4 % (ref 0.0–5.0)
Eosinophils Absolute: 0.1 10*3/uL (ref 0.0–0.7)
HCT: 35.6 % — ABNORMAL LOW (ref 36.0–46.0)
Hemoglobin: 12.1 g/dL (ref 12.0–15.0)
LYMPHS ABS: 2.3 10*3/uL (ref 0.7–4.0)
Lymphocytes Relative: 23.7 % (ref 12.0–46.0)
MCHC: 34.1 g/dL (ref 30.0–36.0)
MCV: 88.5 fl (ref 78.0–100.0)
MONO ABS: 0.6 10*3/uL (ref 0.1–1.0)
MONOS PCT: 5.8 % (ref 3.0–12.0)
NEUTROS ABS: 6.6 10*3/uL (ref 1.4–7.7)
NEUTROS PCT: 68.4 % (ref 43.0–77.0)
PLATELETS: 332 10*3/uL (ref 150.0–400.0)
RBC: 4.02 Mil/uL (ref 3.87–5.11)
RDW: 13.9 % (ref 11.5–15.5)
WBC: 9.6 10*3/uL (ref 4.0–10.5)

## 2015-12-21 LAB — BASIC METABOLIC PANEL
BUN: 14 mg/dL (ref 6–23)
CALCIUM: 9.1 mg/dL (ref 8.4–10.5)
CO2: 29 meq/L (ref 19–32)
Chloride: 104 mEq/L (ref 96–112)
Creatinine, Ser: 0.65 mg/dL (ref 0.40–1.20)
GFR: 108.25 mL/min (ref >60.00)
GLUCOSE: 82 mg/dL (ref 70–99)
POTASSIUM: 3.9 meq/L (ref 3.5–5.1)
SODIUM: 138 meq/L (ref 135–145)

## 2015-12-21 LAB — LIPID PANEL
CHOLESTEROL: 214 mg/dL — AB (ref 0–200)
HDL: 69.3 mg/dL (ref >39.00)
LDL Cholesterol: 131 mg/dL — ABNORMAL HIGH (ref 0–99)
NonHDL: 144.45
TRIGLYCERIDES: 65 mg/dL (ref 0.0–149.0)
Total CHOL/HDL Ratio: 3
VLDL: 13 mg/dL (ref 0.0–40.0)

## 2015-12-21 LAB — HIV ANTIBODY (ROUTINE TESTING W REFLEX): HIV 1&2 Ab, 4th Generation: NONREACTIVE

## 2015-12-21 LAB — HEPATIC FUNCTION PANEL
ALBUMIN: 3.9 g/dL (ref 3.5–5.2)
ALT: 17 U/L (ref 0–35)
AST: 14 U/L (ref 0–37)
Alkaline Phosphatase: 57 U/L (ref 39–117)
BILIRUBIN TOTAL: 0.5 mg/dL (ref 0.2–1.2)
Bilirubin, Direct: 0.1 mg/dL (ref 0.0–0.3)
Total Protein: 7.1 g/dL (ref 6.0–8.3)

## 2015-12-21 LAB — TSH: TSH: 2.34 u[IU]/mL (ref 0.35–4.50)

## 2015-12-21 MED ORDER — PHENTERMINE HCL 37.5 MG PO TABS: ORAL_TABLET | ORAL | 0 refills | Status: DC

## 2015-12-21 MED FILL — PHENTERMINE 37.5 MG TABLET: 37.5 | 30 days supply | Qty: 30 | Fill #0

## 2015-12-21 NOTE — Progress Notes (Signed)
Subjective:    Patient ID: Peggy Green, female    DOB: Sep 02, 1975, 40 y.o.   MRN: [id number]  HPI  Patient presents today for complete physical.  Immunizations:  Tetanus 2014 Diet: reports that her diet  Exercise: reports that she works on a treadmill.   Reports that she feels like she has a "haze over her eyes." Headaches.  Has had IUD for 2-3 years.  Thinks that IUD may be causing her some side effects such as headache and she wishes to discuss other birth control alternatives.   Wt Readings from Last 3 Encounters:  12/21/15 183 lb 3.2 oz (83.1 kg)  11/25/15 184 lb 12.8 oz (83.8 kg)  11/18/13 171 lb (77.6 kg)   Reports sports med doctor started her on phentermine for weight loss and told her to follow up with [place] for refills.   Review of Systems  Constitutional: Negative for unexpected weight change.  HENT: Negative for rhinorrhea.   Respiratory: Negative for cough.   Cardiovascular: Negative for leg swelling.  Gastrointestinal: Negative for diarrhea.       Occasional constipation  Genitourinary: Negative for dysuria.       + urinary frequency, "since I was a child."  Musculoskeletal: Negative for arthralgias and myalgias.  Skin: Negative for rash.  Neurological: Negative for headaches.  Hematological: Negative for adenopathy.  Psychiatric/Behavioral:       Denies depression/anxiety   Past Medical History:  Diagnosis Date  . Cervical dysplasia   . History of Helicobacter pylori infection    treated 2014     Social History   Social History  . Marital status: Married    Spouse name: N/A  . Number of children: N/A  . Years of education: N/A   Occupational History  . Not on file.   Social History Main Topics  . Smoking status: Never Smoker  . Smokeless tobacco: Never Used  . Alcohol use Yes     Comment: SOCIAL  . Drug use: No  . Sexual activity: Yes   Other Topics Concern  . Not on file   Social History Narrative   Lives with husband and 2 children  (ages 56 and 84)   From [place] originally   She stays home   Completed 2 yrs of college   Enjoys staying involved with school    Past Surgical History:  Procedure Laterality Date  . LEEP  06/14/2011   Procedure: LOOP ELECTROSURGICAL EXCISION PROCEDURE (LEEP);  Surgeon: Terrance Mass, MD;  Location: [facility];  Service: Gynecology;  Laterality: N/A;    Family History  Problem Relation Age of Onset  . Thyroid disease Mother   . Hyperlipidemia Mother   . Hypertension Mother   . Cancer Maternal Grandfather     STOMACH    No Known Allergies  Current Outpatient Prescriptions on File Prior to Visit  Medication Sig Dispense Refill  . meloxicam (MOBIC) 15 MG tablet One tab PO qAM with breakfast for 2 weeks, then daily prn pain. 30 tablet 3  . phentermine (ADIPEX-P) 37.5 MG tablet One tab by mouth qAM 30 tablet 0  . predniSONE (DELTASONE) 50 MG tablet One tab PO daily for 5 days. (Patient not taking: Reported on 12/14/2015) 5 tablet 0   No current facility-administered medications on file prior to visit.     BP 100/61 (BP Location: Right Arm, Cuff Size: Large)   Pulse (!) 55   Temp 98.3 F (36.8 C) (Oral)   Resp 16  Ht 4\' 11"  (1.499 m)   Wt 183 lb 3.2 oz (83.1 kg)   SpO2 100% Comment: room air  BMI 37.00 kg/m       Objective:   Physical Exam  Physical Exam  Constitutional: She is oriented to person, place, and time. She appears well-developed and well-nourished. No distress.  HENT:  Head: Normocephalic and atraumatic.  Right Ear: Tympanic membrane and ear canal normal.  Left Ear: Tympanic membrane and ear canal normal.  Mouth/Throat: Oropharynx is clear and moist.  Eyes: Pupils are equal, round, and reactive to light. No scleral icterus.  Neck: Normal range of motion. No thyromegaly present.  Cardiovascular: Normal rate and regular rhythm.   No murmur heard. Pulmonary/Chest: Effort normal and breath sounds normal. No respiratory distress. He has no  wheezes. She has no rales. She exhibits no tenderness.  Abdominal: Soft. Bowel sounds are normal. She exhibits no distension and no mass. There is no tenderness. There is no rebound and no guarding.  Musculoskeletal: She exhibits no edema.  Lymphadenopathy:    She has no cervical adenopathy.  Neurological: She is alert and oriented to person, place, and time. She has normal patellar reflexes. She exhibits normal muscle tone. Coordination normal.  Skin: Skin is warm and dry.  Psychiatric: She has a normal mood and affect. Her behavior is normal. Judgment and thought content normal.  Breasts: Examined lying Right: Without masses, retractions, discharge or axillary adenopathy.  Left: Without masses, retractions, discharge or axillary adenopathy.  Pelvic: deferred Assessment & Plan:        Assessment & Plan:  Morbid Obesity- advised pt that I would treat her for 12 weeks with phentermine. Controlled substance contract is signed today.  Hx of abnormal pap- advised pt to follow up with her GYN for pap smear and to discuss IUD removal.    Preventative care- discussed diet/exercise/weight loss. Obtain routine lab work. Flu shot today.

## 2015-12-21 NOTE — Progress Notes (Signed)
Pre visit review using our clinic review tool, if applicable. No additional management support is needed unless otherwise documented below in the visit note. 

## 2015-12-21 NOTE — Patient Instructions (Addendum)
Please complete lab work prior to leaving. Continue to work on [employer], exercise and weight loss.  Please schedule a follow up appointment with Dr. Toney Rakes to address your birth control concerns and for follow up pap smear.

## 2015-12-22 ENCOUNTER — Encounter: Payer: Self-pay | Admitting: Family

## 2015-12-23 ENCOUNTER — Ambulatory Visit: Payer: 59 | Admitting: Sports Medicine

## 2015-12-23 ENCOUNTER — Ambulatory Visit: Payer: Managed Care, Other (non HMO) | Admitting: Gynecology

## 2016-01-25 ENCOUNTER — Telehealth: Payer: Self-pay | Admitting: Family

## 2016-01-25 DIAGNOSIS — E669 Obesity, unspecified: Secondary | ICD-10-CM

## 2016-01-25 DIAGNOSIS — Z6837 Body mass index (BMI) 37.0-37.9, adult: Principal | ICD-10-CM

## 2016-01-25 MED ORDER — PHENTERMINE HCL 37.5 MG PO TABS: ORAL_TABLET | ORAL | 0 refills | Status: DC

## 2016-01-25 MED FILL — PHENTERMINE 37.5 MG TABLET: 37.5 | 30 days supply | Qty: 30 | Fill #0

## 2016-01-25 NOTE — Telephone Encounter (Signed)
Pt has f/u 02/23/16. Last Rx: 12/21/15, #30.  Rx printed and forwarded to PCP for signature.

## 2016-01-25 NOTE — Telephone Encounter (Signed)
Rx faxed to pharmacy  

## 2016-01-25 NOTE — Telephone Encounter (Signed)
Notified pt. 

## 2016-01-25 NOTE — Telephone Encounter (Signed)
Caller name: Relationship to patient: Self Can be reached: [phone] Pharmacy:  [place], [place] - [address] [phone] (Phone) [phone] (Fax)     Reason for call: Request refill on phentermine (ADIPEX-P) 37.5 MG tablet [id number]

## 2016-02-23 ENCOUNTER — Ambulatory Visit: Payer: 59 | Admitting: Family

## 2016-02-29 ENCOUNTER — Telehealth: Payer: Self-pay | Admitting: Family

## 2016-02-29 DIAGNOSIS — Z6837 Body mass index (BMI) 37.0-37.9, adult: Principal | ICD-10-CM

## 2016-02-29 DIAGNOSIS — E669 Obesity, unspecified: Secondary | ICD-10-CM

## 2016-02-29 MED ORDER — PHENTERMINE HCL 37.5 MG PO TABS: ORAL_TABLET | ORAL | 0 refills | Status: DC

## 2016-02-29 NOTE — Telephone Encounter (Signed)
Rx faxed to pharmacy and notified pt. 

## 2016-02-29 NOTE — Telephone Encounter (Signed)
Yes, please see rx.

## 2016-02-29 NOTE — Telephone Encounter (Signed)
Caller name: Relationship to patient: Self Can be reached: [phone] Pharmacy:  [place], [place] - [address] [phone] ([phone] (Fax)     Reason for call: Refill on phentermine (ADIPEX-P) 37.5 MG tablet [id number]

## 2016-02-29 NOTE — Addendum Note (Signed)
Addended by: Debbrah Alar on: 02/29/2016 05:45 PM   Modules accepted: Orders

## 2016-02-29 NOTE — Telephone Encounter (Signed)
Last Rx: 01/25/16 Next OV: 03/22/16  Please advise if ok to refill phentermine?

## 2016-03-08 MED FILL — PHENTERMINE 37.5 MG TABLET: 37.5 | 30 days supply | Qty: 30 | Fill #0

## 2016-03-22 ENCOUNTER — Ambulatory Visit: Payer: 59 | Admitting: Family

## 2016-04-11 ENCOUNTER — Ambulatory Visit: Payer: Managed Care, Other (non HMO) | Admitting: Gynecology

## 2016-04-26 ENCOUNTER — Ambulatory Visit (INDEPENDENT_AMBULATORY_CARE_PROVIDER_SITE_OTHER): Payer: 59 | Admitting: Gynecology

## 2016-04-26 ENCOUNTER — Encounter: Payer: Self-pay | Admitting: Gynecology

## 2016-04-26 VITALS — BP 110/70 | Ht 59.0 in | Wt 186.0 lb

## 2016-04-26 DIAGNOSIS — Z01411 Encounter for gynecological examination (general) (routine) with abnormal findings: Secondary | ICD-10-CM

## 2016-04-26 DIAGNOSIS — Z87410 Personal history of cervical dysplasia: Secondary | ICD-10-CM

## 2016-04-26 NOTE — Addendum Note (Signed)
Addended by: Burnett Kanaris on: 04/26/2016 03:52 PM   Modules accepted: Orders

## 2016-04-26 NOTE — Progress Notes (Signed)
Peggy Green 21-Jun-1975 [id number]   History:    40 y.o.  for annual gyn exam  2014. Review of her record indicated that in May 2013 she had the Mirena IUD placed. She reports very light if any menstrual cycles. Also in 2013 she had a LEEP cervical conization whereby CIN-2 was excised. She had a Pap smear at another doctor's office which she stated was normal we do not have those records. Her PCP has been doing her blood work and her vaccines are up-to-date.  Past medical history,surgical history, family history and social history were all reviewed and documented in the EPIC chart.  Gynecologic History Patient's last menstrual period was 04/19/2016 (lmp unknown). Contraception: IUD Last Pap: See above. Results were: see above Last mammogram:  Not indicated. Results were:  Not indicated  Obstetric History OB History  Gravida Para Term Preterm AB Living  2 2 2     2   SAB TAB Ectopic Multiple Live Births          2    # Outcome Date GA Lbr Len/2nd Weight Sex Delivery Anes PTL Lv  2 Term     M Vag-Spont  N LIV  1 Term     F Vag-Spont  N LIV       ROS: A ROS was performed and pertinent positives and negatives are included in the history.  GENERAL: No fevers or chills. HEENT: No change in vision, no earache, sore throat or sinus congestion. NECK: No pain or stiffness. CARDIOVASCULAR: No chest pain or pressure. No palpitations. PULMONARY: No shortness of breath, cough or wheeze. GASTROINTESTINAL: No abdominal pain, nausea, vomiting or diarrhea, melena or bright red blood per rectum. GENITOURINARY: No urinary frequency, urgency, hesitancy or dysuria. MUSCULOSKELETAL: No joint or muscle pain, no back pain, no recent trauma. DERMATOLOGIC: No rash, no itching, no lesions. ENDOCRINE: No polyuria, polydipsia, no heat or cold intolerance. No recent change in weight. HEMATOLOGICAL: No anemia or easy bruising or bleeding. NEUROLOGIC: No headache, seizures, numbness, tingling or weakness.  PSYCHIATRIC: No depression, no loss of interest in normal activity or change in sleep pattern.     Exam: chaperone present  BP 110/70   Ht 4\' 11"  (1.499 m)   Wt 186 lb (84.4 kg)   LMP 04/19/2016 (LMP Unknown)   BMI 37.57 kg/m   Body mass index is 37.57 kg/m.  General appearance : Well developed well nourished female. No acute distress HEENT: Eyes: no retinal hemorrhage or exudates,  Neck supple, trachea midline, no carotid bruits, no thyroidmegaly Lungs: Clear to auscultation, no rhonchi or wheezes, or rib retractions  Heart: Regular rate and rhythm, no murmurs or gallops Breast:Examined in sitting and supine position were symmetrical in appearance, no palpable masses or tenderness,  no skin retraction, no nipple inversion, no nipple discharge, no skin discoloration, no axillary or supraclavicular lymphadenopathy Abdomen: no palpable masses or tenderness, no rebound or guarding Extremities: no edema or skin discoloration or tenderness  Pelvic:  Bartholin, Urethra, Skene Glands: Within normal limits             Vagina: No gross lesions or discharge  Cervix: No gross lesions or discharge  Uterus   anteverted, normal size, shape and consistency, non-tender and mobile  Adnexa  Without masses or tenderness  Anus and perineum  normal   Rectovaginal  normal sphincter tone without palpated masses or tenderness             Hemoccult  Not indicated  Assessment/Plan:  41 y.o. female for annual exam  Was reminded that in May of this year she needs to change her Mirena IUD. We discussed importance of monthly breast exam. Her blood work and vaccines are up-to-date. We did do a Pap smear with HPV screening. I've recommended regardless of the guidelines because of her CIN-2 history in the past that she should have a Pap smear every year.   Terrance Mass MD, 3:42 PM 04/26/2016

## 2016-04-28 LAB — PAP, TP IMAGING W/ HPV RNA, RFLX HPV TYPE 16,18/45: HPV mRNA, High Risk: NOT DETECTED

## 2016-06-23 ENCOUNTER — Telehealth: Payer: Self-pay | Admitting: *Deleted

## 2016-06-23 NOTE — Telephone Encounter (Signed)
That will be okay

## 2016-06-23 NOTE — Telephone Encounter (Signed)
Staff message sent to wendy to check benefits.

## 2016-06-23 NOTE — Telephone Encounter (Signed)
Pt is due to have Mirena IUD removed and re-inserted in May, she would like to try the paraguard IUD this time, has had in past before. States increased weight gain with Mirena IUD, if you are okay with this I will forward wendy to check benefits. Please advise

## 2016-07-27 ENCOUNTER — Ambulatory Visit: Payer: 59 | Admitting: Gynecology

## 2016-07-28 ENCOUNTER — Encounter: Payer: Self-pay | Admitting: Gynecology

## 2016-07-28 ENCOUNTER — Ambulatory Visit (INDEPENDENT_AMBULATORY_CARE_PROVIDER_SITE_OTHER): Payer: 59 | Admitting: Gynecology

## 2016-07-28 VITALS — BP 118/86 | Ht 59.0 in | Wt 195.2 lb

## 2016-07-28 DIAGNOSIS — Z30433 Encounter for removal and reinsertion of intrauterine contraceptive device: Secondary | ICD-10-CM | POA: Diagnosis not present

## 2016-07-28 DIAGNOSIS — Z30432 Encounter for removal of intrauterine contraceptive device: Secondary | ICD-10-CM

## 2016-07-28 DIAGNOSIS — Z30430 Encounter for insertion of intrauterine contraceptive device: Secondary | ICD-10-CM

## 2016-07-28 DIAGNOSIS — Z975 Presence of (intrauterine) contraceptive device: Secondary | ICD-10-CM | POA: Insufficient documentation

## 2016-07-28 NOTE — Progress Notes (Signed)
   Patient is a 41 year old that was seen the office in February 2018 for her annual exam see previous note for details. Patient is a Mirena IUD had been placed in 2013 and was due to be removed and she's here for its removal and would like a nonhormonal IUD such as the Junior which she had been provided with literature information.                                                                    IUD procedure note       Patient presented to the office today for removal of expired Mirena IUD and replaced with [place] IUD. The patient had previously been provided with literature information on this method of contraception. The risks benefits and pros and cons were discussed and all her questions were answered. She is fully aware that this form of contraception is 99% effective and is good for 10 years.  Pelvic exam: Bartholin urethra Skene glands: Within normal limits Vagina: No lesions or discharge, first-degree uterine descensus/with cystocele Cervix: No lesions or discharge Uterus: Anteverted position Adnexa: No masses or tenderness Rectal exam: Not done  The cervix was cleansed with Betadine solution. A single-tooth tenaculum was placed on the anterior cervical lip. The  cervix required dilatation. The IUD string was grasped with a [place] clamp and the IUD was shown to the patient discarded. centimeter. The uterus was then sounded to 7 cm. The new IUD was shown to the patient and inserted in a sterile fashion. The IUD string was trimmed. The single-tooth tenaculum was removed. Patient was instructed to return back to the office in one month for follow up.    She was given a couple of Aleve for cramping. Literature information provided for post procedural care.

## 2016-07-28 NOTE — Patient Instructions (Signed)

## 2016-08-01 ENCOUNTER — Encounter: Payer: Self-pay | Admitting: Anesthesiology

## 2016-08-03 ENCOUNTER — Encounter: Payer: Self-pay | Admitting: Gynecology

## 2016-08-22 ENCOUNTER — Ambulatory Visit: Payer: 59 | Admitting: Gynecology

## 2016-08-23 ENCOUNTER — Encounter: Payer: Self-pay | Admitting: Gynecology

## 2016-08-23 ENCOUNTER — Ambulatory Visit (INDEPENDENT_AMBULATORY_CARE_PROVIDER_SITE_OTHER): Payer: 59 | Admitting: Gynecology

## 2016-08-23 VITALS — BP 120/70

## 2016-08-23 DIAGNOSIS — Z30431 Encounter for routine checking of intrauterine contraceptive device: Secondary | ICD-10-CM | POA: Diagnosis not present

## 2016-08-23 NOTE — Progress Notes (Signed)
   Patient presented to the office today for her 1 month follow-up after replacing her expired Mirena IUD. She is doing well and would like to have the string trimmed.  Exam: Pelvic: Bartholin urethra Skene was within normal limits Vagina: No lesions or discharge Cervix: IUD string seen and trimmed Bimanual exam: Uterus anteverted normal size shape and consistency Adnexa: No palpable masses or tenderness Rectal exam: Not done  Assessment/plan: One month status post replacing Mirena IUD doing well. Patient to return to the office in February next year for her annual exam. 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

## 2017-05-02 ENCOUNTER — Encounter: Payer: Self-pay | Admitting: Obstetrics & Gynecology

## 2017-05-02 ENCOUNTER — Ambulatory Visit (INDEPENDENT_AMBULATORY_CARE_PROVIDER_SITE_OTHER): Payer: 59 | Admitting: Obstetrics & Gynecology

## 2017-05-02 VITALS — BP 126/84 | Ht 59.5 in | Wt 161.0 lb

## 2017-05-02 DIAGNOSIS — Z01419 Encounter for gynecological examination (general) (routine) without abnormal findings: Secondary | ICD-10-CM

## 2017-05-02 DIAGNOSIS — Z01411 Encounter for gynecological examination (general) (routine) with abnormal findings: Secondary | ICD-10-CM

## 2017-05-02 DIAGNOSIS — Z30431 Encounter for routine checking of intrauterine contraceptive device: Secondary | ICD-10-CM

## 2017-05-02 DIAGNOSIS — Z1151 Encounter for screening for human papillomavirus (HPV): Secondary | ICD-10-CM | POA: Diagnosis not present

## 2017-05-02 DIAGNOSIS — N8110 Cystocele, unspecified: Secondary | ICD-10-CM | POA: Diagnosis not present

## 2017-05-02 NOTE — Progress Notes (Signed)
Peggy Green [birth year] [id number]   History:    42 y.o. G2P2L2 Married.  Daughter 54 yo at [employer], son 65 yo sophomore in [place].  RP:  Established patient presenting for annual gyn exam   HPI: Menses regular normal on ParaGard IUD since May 2018.  No pelvic pain.  Normal vaginal secretions.  No pain with intercourse.  Urine and bowel movements normal.  Breasts normal.  Past medical history,surgical history, family history and social history were all reviewed and documented in the EPIC chart.  Gynecologic History Patient's last menstrual period was 04/18/2017. Contraception: Paragard IUD x 07/2016 Last Pap: 04/2016. Results were: Negative Last mammogram: Never Bone Density: Never Colonoscopy: Never  Obstetric History OB History  Gravida Para Term Preterm AB Living  2 2 2     2   SAB TAB Ectopic Multiple Live Births          2    # Outcome Date GA Lbr Len/2nd Weight Sex Delivery Anes PTL Lv  2 Term     M Vag-Spont  N LIV  1 Term     F Vag-Spont  N LIV       ROS: A ROS was performed and pertinent positives and negatives are included in the history.  GENERAL: No fevers or chills. HEENT: No change in vision, no earache, sore throat or sinus congestion. NECK: No pain or stiffness. CARDIOVASCULAR: No chest pain or pressure. No palpitations. PULMONARY: No shortness of breath, cough or wheeze. GASTROINTESTINAL: No abdominal pain, nausea, vomiting or diarrhea, melena or bright red blood per rectum. GENITOURINARY: No urinary frequency, urgency, hesitancy or dysuria. MUSCULOSKELETAL: No joint or muscle pain, no back pain, no recent trauma. DERMATOLOGIC: No rash, no itching, no lesions. ENDOCRINE: No polyuria, polydipsia, no heat or cold intolerance. No recent change in weight. HEMATOLOGICAL: No anemia or easy bruising or bleeding. NEUROLOGIC: No headache, seizures, numbness, tingling or weakness. PSYCHIATRIC: No depression, no loss of interest in normal activity or change in sleep  pattern.     Exam:   BP 126/84   Ht 4' 11.5" (1.511 m)   Wt 161 lb (73 kg)   LMP 04/18/2017 Comment: PARAGARD  BMI 31.97 kg/m   Body mass index is 31.97 kg/m.  General appearance : Well developed well nourished female. No acute distress HEENT: Eyes: no retinal hemorrhage or exudates,  Neck supple, trachea midline, no carotid bruits, no thyroidmegaly Lungs: Clear to auscultation, no rhonchi or wheezes, or rib retractions  Heart: Regular rate and rhythm, no murmurs or gallops Breast:Examined in sitting and supine position were symmetrical in appearance, no palpable masses or tenderness,  no skin retraction, no nipple inversion, no nipple discharge, no skin discoloration, no axillary or supraclavicular lymphadenopathy Abdomen: no palpable masses or tenderness, no rebound or guarding Extremities: no edema or skin discoloration or tenderness  Pelvic: Vulva: Normal             Vagina: No gross lesions or discharge.  Cystocele grade 2/4.  Cervix: No gross lesions or discharge.  IUD strings seen.  Pap/HR HPV done.  Uterus  AV, normal size, shape and consistency, non-tender and mobile  Adnexa  Without masses or tenderness  Anus: Normal   Assessment/Plan:  42 y.o. female for annual exam   1. Encounter for gynecological examination with abnormal finding Normal gynecologic exam.  Pap with high risk HPV done today.  Breast exam normal.  Will start screening mammogram at 42 year old.  Health labs with family physician.  2. Encounter for routine checking of intrauterine contraceptive device (IUD) Well on ParaGard IUD since May 2018.  IUD in good position.  3. Baden-Walker grade 2 cystocele Mildly symptomatic cystocele.  No significant stress urinary incontinence.  Decision to observe.  Recommended to avoid pelvic pressure including treating any constipation or cough, avoiding heavy lifting and doing regular Kegel exercises.  4. Special screening examination for human papillomavirus  (HPV)  - PAP,TP IMGw/HPV RNA,rflx [id number]/26   Princess Bruins MD, 2:54 PM 05/02/2017

## 2017-05-03 ENCOUNTER — Encounter: Payer: Self-pay | Admitting: Obstetrics & Gynecology

## 2017-05-05 LAB — PAP, TP IMAGING W/ HPV RNA, RFLX HPV TYPE 16,18/45: HPV DNA High Risk: NOT DETECTED

## 2017-05-06 ENCOUNTER — Encounter: Payer: Self-pay | Admitting: Obstetrics & Gynecology

## 2017-05-06 NOTE — Patient Instructions (Signed)
1. Encounter for gynecological examination with abnormal finding Normal gynecologic exam.  Pap with high risk HPV done today.  Breast exam normal.  Will start screening mammogram at 42 year old.  Health labs with family physician.  2. Encounter for routine checking of intrauterine contraceptive device (IUD) Well on ParaGard IUD since May 2018.  IUD in good position.  3. Baden-Walker grade 2 cystocele Mildly symptomatic cystocele.  No significant stress urinary incontinence.  Decision to observe.  Recommended to avoid pelvic pressure including treating any constipation or cough, avoiding heavy lifting and doing regular Kegel exercises.  4. Special screening examination for human papillomavirus (HPV)  - PAP,TP IMGw/HPV RNA,rflx [id number]/41  Peggy Green, it was a pleasure meeting you today!  I will inform you of your results as soon as they are available.  Health Maintenance, Female Adopting a healthy lifestyle and getting preventive care can go a long way to promote health and wellness. Talk with your health care provider about what schedule of regular examinations is right for you. This is a good chance for you to check in with your provider about disease prevention and staying healthy. In between checkups, there are plenty of things you can do on your own. Experts have done a lot of research about which lifestyle changes and preventive measures are most likely to keep you healthy. Ask your health care provider for more information. Weight and diet Eat a healthy diet  Be sure to include plenty of vegetables, fruits, low-fat dairy products, and lean protein.  Do not eat a lot of foods high in solid fats, added sugars, or salt.  Get regular exercise. This is one of the most important things you can do for your health. ? Most adults should exercise for at least 150 minutes each week. The exercise should increase your heart rate and make you sweat (moderate-intensity exercise). ? Most adults  should also do strengthening exercises at least twice a week. This is in addition to the moderate-intensity exercise.  Maintain a healthy weight  Body mass index (BMI) is a measurement that can be used to identify possible weight problems. It estimates body fat based on height and weight. Your health care provider can help determine your BMI and help you achieve or maintain a healthy weight.  For females 51 years of age and older: ? A BMI below 18.5 is considered underweight. ? A BMI of 18.5 to 24.9 is normal. ? A BMI of 25 to 29.9 is considered overweight. ? A BMI of 30 and above is considered obese.  Watch levels of cholesterol and blood lipids  You should start having your blood tested for lipids and cholesterol at 40 years of age, then have this test every 5 years.  You may need to have your cholesterol levels checked more often if: ? Your lipid or cholesterol levels are high. ? You are older than 40 years of age. ? You are at high risk for heart disease.  Cancer screening Lung Cancer  Lung cancer screening is recommended for adults 59-43 years old who are at high risk for lung cancer because of a history of smoking.  A yearly low-dose CT scan of the lungs is recommended for people who: ? Currently smoke. ? Have quit within the past 15 years. ? Have at least a 30-pack-year history of smoking. A pack year is smoking an average of one pack of cigarettes a day for 1 year.  Yearly screening should continue until it has been 15 years since you quit.  Yearly screening should stop if you develop a health problem that would prevent you from having lung cancer treatment.  Breast Cancer  Practice breast self-awareness. This means understanding how your breasts normally appear and feel.  It also means doing regular breast self-exams. Let your health care provider know about any changes, no matter how small.  If you are in your 20s or 30s, you should have a clinical breast exam (CBE)  by a health care provider every 1-3 years as part of a regular health exam.  If you are 82 or older, have a CBE every year. Also consider having a breast X-ray (mammogram) every year.  If you have a family history of breast cancer, talk to your health care provider about genetic screening.  If you are at high risk for breast cancer, talk to your health care provider about having an MRI and a mammogram every year.  Breast cancer gene (BRCA) assessment is recommended for women who have family members with BRCA-related cancers. BRCA-related cancers include: ? Breast. ? Ovarian. ? Tubal. ? Peritoneal cancers.  Results of the assessment will determine the need for genetic counseling and BRCA1 and BRCA2 testing.  Cervical Cancer Your health care provider may recommend that you be screened regularly for cancer of the pelvic organs (ovaries, uterus, and vagina). This screening involves a pelvic examination, including checking for microscopic changes to the surface of your cervix (Pap test). You may be encouraged to have this screening done every 3 years, beginning at age 42.  For women ages 31-65, health care providers may recommend pelvic exams and Pap testing every 3 years, or they may recommend the Pap and pelvic exam, combined with testing for human papilloma virus (HPV), every 5 years. Some types of HPV increase your risk of cervical cancer. Testing for HPV may also be done on women of any age with unclear Pap test results.  Other health care providers may not recommend any screening for nonpregnant women who are considered low risk for pelvic cancer and who do not have symptoms. Ask your health care provider if a screening pelvic exam is right for you.  If you have had past treatment for cervical cancer or a condition that could lead to cancer, you need Pap tests and screening for cancer for at least 20 years after your treatment. If Pap tests have been discontinued, your risk factors (such as  having a new sexual partner) need to be reassessed to determine if screening should resume. Some women have medical problems that increase the chance of getting cervical cancer. In these cases, your health care provider may recommend more frequent screening and Pap tests.  Colorectal Cancer  This type of cancer can be detected and often prevented.  Routine colorectal cancer screening usually begins at 40 years of age and continues through 40 years of age.  Your health care provider may recommend screening at an earlier age if you have risk factors for colon cancer.  Your health care provider may also recommend using home test kits to check for hidden blood in the stool.  A small camera at the end of a tube can be used to examine your colon directly (sigmoidoscopy or colonoscopy). This is done to check for the earliest forms of colorectal cancer.  Routine screening usually begins at age 13.  Direct examination of the colon should be repeated every 5-10 years through 40 years of age. However, you may need to be screened more often if early forms of precancerous  polyps or small growths are found.  Skin Cancer  Check your skin from head to toe regularly.  Tell your health care provider about any new moles or changes in moles, especially if there is a change in a mole's shape or color.  Also tell your health care provider if you have a mole that is larger than the size of a pencil eraser.  Always use sunscreen. Apply sunscreen liberally and repeatedly throughout the day.  Protect yourself by wearing long sleeves, pants, a wide-brimmed hat, and sunglasses whenever you are outside.  Heart disease, diabetes, and high blood pressure  High blood pressure causes heart disease and increases the risk of stroke. High blood pressure is more likely to develop in: ? People who have blood pressure in the high end of the normal range (130-139/85-89 mm Hg). ? People who are overweight or  obese. ? People who are African American.  If you are 73-1 years of age, have your blood pressure checked every 3-5 years. If you are 5 years of age or older, have your blood pressure checked every year. You should have your blood pressure measured twice-once when you are at a hospital or clinic, and once when you are not at a hospital or clinic. Record the average of the two measurements. To check your blood pressure when you are not at a hospital or clinic, you can use: ? An automated blood pressure machine at a pharmacy. ? A home blood pressure monitor.  If you are between 31 years and 16 years old, ask your health care provider if you should take aspirin to prevent strokes.  Have regular diabetes screenings. This involves taking a blood sample to check your fasting blood sugar level. ? If you are at a normal weight and have a low risk for diabetes, have this test once every three years after 40 years of age. ? If you are overweight and have a high risk for diabetes, consider being tested at a younger age or more often. Preventing infection Hepatitis B  If you have a higher risk for hepatitis B, you should be screened for this virus. You are considered at high risk for hepatitis B if: ? You were born in a country where hepatitis B is common. Ask your health care provider which countries are considered high risk. ? Your parents were born in a high-risk country, and you have not been immunized against hepatitis B (hepatitis B vaccine). ? You have HIV or AIDS. ? You use needles to inject street drugs. ? You live with someone who has hepatitis B. ? You have had sex with someone who has hepatitis B. ? You get hemodialysis treatment. ? You take certain medicines for conditions, including cancer, organ transplantation, and autoimmune conditions.  Hepatitis C  Blood testing is recommended for: ? Everyone born from [birth year] through 1963. ? Anyone with known risk factors for hepatitis  C.  Sexually transmitted infections (STIs)  You should be screened for sexually transmitted infections (STIs) including gonorrhea and chlamydia if: ? You are sexually active and are younger than 40 years of age. ? You are older than 40 years of age and your health care provider tells you that you are at risk for this type of infection. ? Your sexual activity has changed since you were last screened and you are at an increased risk for chlamydia or gonorrhea. Ask your health care provider if you are at risk.  If you do not have HIV, but are at  risk, it may be recommended that you take a prescription medicine daily to prevent HIV infection. This is called pre-exposure prophylaxis (PrEP). You are considered at risk if: ? You are sexually active and do not regularly use condoms or know the HIV status of your partner(s). ? You take drugs by injection. ? You are sexually active with a partner who has HIV.  Talk with your health care provider about whether you are at high risk of being infected with HIV. If you choose to begin PrEP, you should first be tested for HIV. You should then be tested every 3 months for as long as you are taking PrEP. Pregnancy  If you are premenopausal and you may become pregnant, ask your health care provider about preconception counseling.  If you may become pregnant, take 400 to 800 micrograms (mcg) of folic acid every day.  If you want to prevent pregnancy, talk to your health care provider about birth control (contraception). Osteoporosis and menopause  Osteoporosis is a disease in which the bones lose minerals and strength with aging. This can result in serious bone fractures. Your risk for osteoporosis can be identified using a bone density scan.  If you are 4 years of age or older, or if you are at risk for osteoporosis and fractures, ask your health care provider if you should be screened.  Ask your health care provider whether you should take a calcium or  vitamin D supplement to lower your risk for osteoporosis.  Menopause may have certain physical symptoms and risks.  Hormone replacement therapy may reduce some of these symptoms and risks. Talk to your health care provider about whether hormone replacement therapy is right for you. Follow these instructions at home:  Schedule regular health, dental, and eye exams.  Stay current with your immunizations.  Do not use any tobacco products including cigarettes, chewing tobacco, or electronic cigarettes.  If you are pregnant, do not drink alcohol.  If you are breastfeeding, limit how much and how often you drink alcohol.  Limit alcohol intake to no more than 1 drink per day for nonpregnant women. One drink equals 12 ounces of beer, 5 ounces of wine, or 1 ounces of hard liquor.  Do not use street drugs.  Do not share needles.  Ask your health care provider for help if you need support or information about quitting drugs.  Tell your health care provider if you often feel depressed.  Tell your health care provider if you have ever been abused or do not feel safe at home. This information is not intended to replace advice given to you by your health care provider. Make sure you discuss any questions you have with your health care provider. Document Released: 09/20/2010 Document Revised: 08/13/2015 Document Reviewed: 12/09/2014 Elsevier Interactive Patient Education  [employer].

## 2017-05-10 ENCOUNTER — Encounter: Payer: Self-pay | Admitting: *Deleted

## 2018-05-03 ENCOUNTER — Ambulatory Visit (INDEPENDENT_AMBULATORY_CARE_PROVIDER_SITE_OTHER): Payer: 59 | Admitting: Obstetrics & Gynecology

## 2018-05-03 ENCOUNTER — Encounter: Payer: Self-pay | Admitting: Obstetrics & Gynecology

## 2018-05-03 VITALS — BP 124/80 | Ht 59.0 in | Wt 179.0 lb

## 2018-05-03 DIAGNOSIS — E6609 Other obesity due to excess calories: Secondary | ICD-10-CM

## 2018-05-03 DIAGNOSIS — Z01419 Encounter for gynecological examination (general) (routine) without abnormal findings: Secondary | ICD-10-CM

## 2018-05-03 DIAGNOSIS — Z6836 Body mass index (BMI) 36.0-36.9, adult: Secondary | ICD-10-CM

## 2018-05-03 DIAGNOSIS — Z1151 Encounter for screening for human papillomavirus (HPV): Secondary | ICD-10-CM

## 2018-05-03 DIAGNOSIS — Z30431 Encounter for routine checking of intrauterine contraceptive device: Secondary | ICD-10-CM | POA: Diagnosis not present

## 2018-05-03 DIAGNOSIS — Z113 Encounter for screening for infections with a predominantly sexual mode of transmission: Secondary | ICD-10-CM

## 2018-05-03 NOTE — Patient Instructions (Signed)
1. Encounter for routine gynecological examination with Papanicolaou smear of cervix Normal gynecologic exam.  Pap with high-risk HPV done today.  Breast exam normal.  Patient will schedule a screening mammogram now at the breast center.  Follow-up here for fasting health labs. - CBC; Future - Comp Met (CMET); Future - TSH; Future - Lipid panel; Future - VITAMIN D 25 Hydroxy (Vit-D Deficiency, Fractures); Future  2. Encounter for routine checking of intrauterine contraceptive device (IUD) Well on ParaGard IUD since 2018.  IUD is in good position.  3. Screen for STD (sexually transmitted disease) -Gonorrhea and Chlamydia on Pap test - HIV antibody (with reflex) - RPR - Hepatitis C Antibody - Hepatitis B Surface AntiGEN  4. Class 2 obesity due to excess calories without serious comorbidity with body mass index (BMI) of 36.0 to 36.9 in adult Patient on phentermine and beta-hCG injections.  Recommend low calorie/carb diet such as [employer].  Aerobic activities 5 times a week and weightlifting every 2 days.   Peggy Green, it was a pleasure seeing you today!  I will inform you of your results as soon as they are available.

## 2018-05-03 NOTE — Progress Notes (Signed)
Peggy Green [birth year] [id number]   History:    43 y.o. G2P2L2 Married.  Daughter 68 yo, son 45 yo.  RP:  Established patient presenting for annual gyn exam   HPI: Normal menstrual periods every month on ParaGard IUD since 2018.  No breakthrough bleeding.  No pelvic pain.  No pain with intercourse.  Normal vaginal secretions.  Urine and bowel movements normal.  Breasts normal.  Body mass increase since last year now at 36.15.  Patient is now on phentermine and beta-hCG injections.  Low calorie nutrition and increased fitness activities.  We will follow-up here for fasting health labs.  Past medical history,surgical history, family history and social history were all reviewed and documented in the EPIC chart.  Gynecologic History Patient's last menstrual period was 04/16/2018. Contraception: Paragard IUD x 2018 Last Pap: 04/2017. Results were: Negative/HPV HR neg Last mammogram: Will schedule first screening Mammo now at the [facility]: Never Colonoscopy: Never  Obstetric History OB History  Gravida Para Term Preterm AB Living  2 2 2     2   SAB TAB Ectopic Multiple Live Births          2    # Outcome Date GA Lbr Len/2nd Weight Sex Delivery Anes PTL Lv  2 Term     M Vag-Spont  N LIV  1 Term     F Vag-Spont  N LIV     ROS: A ROS was performed and pertinent positives and negatives are included in the history.  GENERAL: No fevers or chills. HEENT: No change in vision, no earache, sore throat or sinus congestion. NECK: No pain or stiffness. CARDIOVASCULAR: No chest pain or pressure. No palpitations. PULMONARY: No shortness of breath, cough or wheeze. GASTROINTESTINAL: No abdominal pain, nausea, vomiting or diarrhea, melena or bright red blood per rectum. GENITOURINARY: No urinary frequency, urgency, hesitancy or dysuria. MUSCULOSKELETAL: No joint or muscle pain, no back pain, no recent trauma. DERMATOLOGIC: No rash, no itching, no lesions. ENDOCRINE: No polyuria,  polydipsia, no heat or cold intolerance. No recent change in weight. HEMATOLOGICAL: No anemia or easy bruising or bleeding. NEUROLOGIC: No headache, seizures, numbness, tingling or weakness. PSYCHIATRIC: No depression, no loss of interest in normal activity or change in sleep pattern.     Exam:   BP 124/80   Ht 4' 11"  (1.499 m)   Wt 179 lb (81.2 kg)   LMP 04/16/2018 Comment: PARAGARD  BMI 36.15 kg/m   Body mass index is 36.15 kg/m.  General appearance : Well developed well nourished female. No acute distress HEENT: Eyes: no retinal hemorrhage or exudates,  Neck supple, trachea midline, no carotid bruits, no thyroidmegaly Lungs: Clear to auscultation, no rhonchi or wheezes, or rib retractions  Heart: Regular rate and rhythm, no murmurs or gallops Breast:Examined in sitting and supine position were symmetrical in appearance, no palpable masses or tenderness,  no skin retraction, no nipple inversion, no nipple discharge, no skin discoloration, no axillary or supraclavicular lymphadenopathy Abdomen: no palpable masses or tenderness, no rebound or guarding Extremities: no edema or skin discoloration or tenderness  Pelvic: Vulva: Normal             Vagina: No gross lesions or discharge  Cervix: No gross lesions or discharge.  Pap/HPV HR, Gono-Chlam  Uterus  AV, normal size, shape and consistency, non-tender and mobile  Adnexa  Without masses or tenderness  Anus: Normal   Assessment/Plan:  43 y.o. female for annual exam   1. Encounter  for routine gynecological examination with Papanicolaou smear of cervix Normal gynecologic exam.  Pap with high-risk HPV done today.  Breast exam normal.  Patient will schedule a screening mammogram now at the breast center.  Follow-up here for fasting health labs. - CBC; Future - Comp Met (CMET); Future - TSH; Future - Lipid panel; Future - VITAMIN D 25 Hydroxy (Vit-D Deficiency, Fractures); Future  2. Encounter for routine checking of intrauterine  contraceptive device (IUD) Well on ParaGard IUD since 2018.  IUD is in good position.  3. Screen for STD (sexually transmitted disease) -Gonorrhea and Chlamydia on Pap test - HIV antibody (with reflex) - RPR - Hepatitis C Antibody - Hepatitis B Surface AntiGEN  4. Class 2 obesity due to excess calories without serious comorbidity with body mass index (BMI) of 36.0 to 36.9 in adult Patient on phentermine and beta-hCG injections.  Recommend low calorie/carb diet such as [employer].  Aerobic activities 5 times a week and weightlifting every 2 days.   Princess Bruins MD, 2:27 PM 05/03/2018

## 2018-05-03 NOTE — Addendum Note (Signed)
Addended by: Thurnell Garbe A on: 05/03/2018 04:34 PM   Modules accepted: Orders

## 2018-05-04 ENCOUNTER — Other Ambulatory Visit: Payer: 59

## 2018-05-04 DIAGNOSIS — Z01419 Encounter for gynecological examination (general) (routine) without abnormal findings: Secondary | ICD-10-CM

## 2018-05-04 LAB — HEPATITIS B SURFACE ANTIGEN: Hepatitis B Surface Ag: NONREACTIVE

## 2018-05-04 LAB — RPR: RPR: NONREACTIVE

## 2018-05-04 LAB — HEPATITIS C ANTIBODY
Hepatitis C Ab: NONREACTIVE
SIGNAL TO CUT-OFF: 0.02 (ref <1.00)

## 2018-05-04 LAB — HIV ANTIBODY (ROUTINE TESTING W REFLEX): HIV: NONREACTIVE

## 2018-05-05 LAB — LIPID PANEL
Cholesterol: 197 mg/dL (ref <200)
HDL: 66 mg/dL (ref >=50)
LDL Cholesterol (Calc): 116 mg/dL (calc) — ABNORMAL HIGH
Non-HDL Cholesterol (Calc): 131 mg/dL (calc) — ABNORMAL HIGH (ref <130)
Total CHOL/HDL Ratio: 3 (calc) (ref <5.0)
Triglycerides: 48 mg/dL (ref <150)

## 2018-05-05 LAB — CBC
HCT: 35.9 % (ref 35.0–45.0)
Hemoglobin: 11.9 g/dL (ref 11.7–15.5)
MCH: 29.5 pg (ref 27.0–33.0)
MCHC: 33.1 g/dL (ref 32.0–36.0)
MCV: 89.1 fL (ref 80.0–100.0)
MPV: 10.3 fL (ref 7.5–12.5)
Platelets: 367 10*3/uL (ref 140–400)
RBC: 4.03 10*6/uL (ref 3.80–5.10)
RDW: 12.7 % (ref 11.0–15.0)
WBC: 5.7 10*3/uL (ref 3.8–10.8)

## 2018-05-05 LAB — COMPREHENSIVE METABOLIC PANEL
AG Ratio: 1.6 (calc) (ref 1.0–2.5)
ALT: 14 U/L (ref 6–29)
AST: 11 U/L (ref 10–30)
Albumin: 4.2 g/dL (ref 3.6–5.1)
Alkaline phosphatase (APISO): 65 U/L (ref 31–125)
BUN: 11 mg/dL (ref 7–25)
CO2: 29 mmol/L (ref 20–32)
Calcium: 9.4 mg/dL (ref 8.6–10.2)
Chloride: 105 mmol/L (ref 98–110)
Creat: 0.74 mg/dL (ref 0.50–1.10)
Globulin: 2.7 g/dL (calc) (ref 1.9–3.7)
Glucose, Bld: 94 mg/dL (ref 65–99)
POTASSIUM: 4.1 mmol/L (ref 3.5–5.3)
Sodium: 140 mmol/L (ref 135–146)
Total Bilirubin: 0.4 mg/dL (ref 0.2–1.2)
Total Protein: 6.9 g/dL (ref 6.1–8.1)

## 2018-05-05 LAB — VITAMIN D 25 HYDROXY (VIT D DEFICIENCY, FRACTURES): Vit D, 25-Hydroxy: 17 ng/mL — ABNORMAL LOW (ref 30–100)

## 2018-05-05 LAB — TSH: TSH: 1.41 mIU/L

## 2018-05-08 LAB — PAP IG, CT-NG NAA, HPV HIGH-RISK
C. trachomatis RNA, TMA: NOT DETECTED
HPV DNA High Risk: NOT DETECTED
N. gonorrhoeae RNA, TMA: NOT DETECTED

## 2018-05-09 ENCOUNTER — Other Ambulatory Visit: Payer: Self-pay | Admitting: Obstetrics & Gynecology

## 2018-05-09 DIAGNOSIS — E559 Vitamin D deficiency, unspecified: Secondary | ICD-10-CM

## 2018-05-09 MED ORDER — VITAMIN D (ERGOCALCIFEROL) 1.25 MG (50000 UNIT) PO CAPS: 50000.0000 [IU] | ORAL_CAPSULE | ORAL | 0 refills | Status: DC

## 2018-08-17 ENCOUNTER — Other Ambulatory Visit: Payer: Self-pay

## 2018-08-20 ENCOUNTER — Other Ambulatory Visit: Payer: Self-pay

## 2018-08-20 ENCOUNTER — Ambulatory Visit: Payer: 59 | Admitting: Obstetrics & Gynecology

## 2018-08-20 ENCOUNTER — Encounter: Payer: Self-pay | Admitting: Obstetrics & Gynecology

## 2018-08-20 VITALS — BP 126/84

## 2018-08-20 DIAGNOSIS — N9412 Deep dyspareunia: Secondary | ICD-10-CM | POA: Diagnosis not present

## 2018-08-20 DIAGNOSIS — R1031 Right lower quadrant pain: Secondary | ICD-10-CM | POA: Diagnosis not present

## 2018-08-20 DIAGNOSIS — Z30431 Encounter for routine checking of intrauterine contraceptive device: Secondary | ICD-10-CM

## 2018-08-20 NOTE — Progress Notes (Signed)
    Peggy Green [birth year] [id number]        43 y.o.  [id number] Married.  Daughter 5, son 68 yo.  RP: Deep dyspareunia x 1 week  HPI: Deep dyspareunia x 1 week. No postcoital bleeding. Intermittent RLQ pain.  Paragard IUD x 2018.  Menses regular normal every month.  LMP 07/17/2018.  No BTB.  Normal vaginal secretions.  Urine/BMs normal.  No fever.   OB History  Gravida Para Term Preterm AB Living  2 2 2     2   SAB TAB Ectopic Multiple Live Births          2    # Outcome Date GA Lbr Len/2nd Weight Sex Delivery Anes PTL Lv  2 Term     M Vag-Spont  N LIV  1 Term     F Vag-Spont  N LIV    Past medical history,surgical history, problem list, medications, allergies, family history and social history were all reviewed and documented in the EPIC chart.   Directed ROS with pertinent positives and negatives documented in the history of present illness/assessment and plan.  Exam:  Vitals:   08/20/18 0938  BP: 126/84   General appearance:  Normal  Abdomen: Normal  Gynecologic exam: Vulva normal.  Speculum:  Cervix/Vagina normal.  IUD strings visible at the EO.  Normal vaginal secretions.  Gono-Chlam done.  Bimanual exam:  Uterus AV, normal volume, mobile, NT.  Cervix tender when more pressure applied.  No adnexal mass felt, NT bilaterally.   Assessment/Plan:  42 y.o. G2P2002   1. Deep dyspareunia Normal gynecologic exam with Paragard IUD in good location.  R/O STI.  F/U with a Pelvic US to r/o Uterine/Ovarian pathology and visualize the exact position of the IUD. - US Transvaginal Non-OB; Future - C. trachomatis/N. gonorrhoeae RNA  2. Intermittent right lower quadrant abdominal pain R/O Ovarian pathology. - US Transvaginal Non-OB; Future - C. trachomatis/N. gonorrhoeae RNA  3. Encounter for routine checking of intrauterine contraceptive device (IUD) Paragard IUD x 2018 in good location.  Will further evaluate IU position by [place]. - US Transvaginal Non-OB; Future  Counseling  on above issues and coordination of care >50% x 25 minutes.  Princess Bruins MD, 10:15 AM 08/20/2018

## 2018-08-21 LAB — C. TRACHOMATIS/N. GONORRHOEAE RNA
C. trachomatis RNA, TMA: NOT DETECTED
N. gonorrhoeae RNA, TMA: NOT DETECTED

## 2018-08-22 LAB — URINALYSIS, COMPLETE W/RFL CULTURE
Bacteria, UA: NONE SEEN /HPF
Bilirubin Urine: NEGATIVE
Glucose, UA: NEGATIVE
Hyaline Cast: NONE SEEN /LPF
Ketones, ur: NEGATIVE
Leukocyte Esterase: NEGATIVE
Nitrites, Initial: NEGATIVE
Protein, ur: NEGATIVE
Specific Gravity, Urine: 1.02 (ref 1.001–1.03)
pH: 5 (ref 5.0–8.0)

## 2018-08-22 LAB — URINE CULTURE
MICRO NUMBER:: 528108
Result:: NO GROWTH
SPECIMEN QUALITY:: ADEQUATE

## 2018-08-22 LAB — CULTURE INDICATED

## 2018-08-25 ENCOUNTER — Encounter: Payer: Self-pay | Admitting: Obstetrics & Gynecology

## 2018-08-25 NOTE — Patient Instructions (Signed)
1. Deep dyspareunia Normal gynecologic exam with Paragard IUD in good location.  R/O STI.  F/U with a Pelvic US to r/o Uterine/Ovarian pathology and visualize the exact position of the IUD. - US Transvaginal Non-OB; Future - C. trachomatis/N. gonorrhoeae RNA  2. Intermittent right lower quadrant abdominal pain R/O Ovarian pathology. - US Transvaginal Non-OB; Future - C. trachomatis/N. gonorrhoeae RNA  3. Encounter for routine checking of intrauterine contraceptive device (IUD) Paragard IUD x 2018 in good location.  Will further evaluate IU position by [place]. - US Transvaginal Non-OB; Future  Shavonte, it was a pleasure seeing you today!

## 2018-08-29 ENCOUNTER — Other Ambulatory Visit: Payer: 59

## 2018-08-29 ENCOUNTER — Ambulatory Visit: Payer: 59 | Admitting: Obstetrics & Gynecology

## 2018-09-12 ENCOUNTER — Encounter: Payer: Self-pay | Admitting: Obstetrics & Gynecology

## 2018-10-03 ENCOUNTER — Other Ambulatory Visit: Payer: Self-pay

## 2018-10-04 ENCOUNTER — Ambulatory Visit (INDEPENDENT_AMBULATORY_CARE_PROVIDER_SITE_OTHER): Payer: 59 | Admitting: Obstetrics & Gynecology

## 2018-10-04 ENCOUNTER — Ambulatory Visit (INDEPENDENT_AMBULATORY_CARE_PROVIDER_SITE_OTHER): Payer: 59

## 2018-10-04 ENCOUNTER — Encounter: Payer: Self-pay | Admitting: Obstetrics & Gynecology

## 2018-10-04 DIAGNOSIS — N9412 Deep dyspareunia: Secondary | ICD-10-CM | POA: Diagnosis not present

## 2018-10-04 DIAGNOSIS — T8389XA Other specified complication of genitourinary prosthetic devices, implants and grafts, initial encounter: Secondary | ICD-10-CM | POA: Diagnosis not present

## 2018-10-04 DIAGNOSIS — R1031 Right lower quadrant pain: Secondary | ICD-10-CM | POA: Diagnosis not present

## 2018-10-04 DIAGNOSIS — T8332XA Displacement of intrauterine contraceptive device, initial encounter: Secondary | ICD-10-CM

## 2018-10-04 DIAGNOSIS — Z30431 Encounter for routine checking of intrauterine contraceptive device: Secondary | ICD-10-CM

## 2018-10-04 DIAGNOSIS — Z30432 Encounter for removal of intrauterine contraceptive device: Secondary | ICD-10-CM | POA: Diagnosis not present

## 2018-10-04 NOTE — Progress Notes (Signed)
    Peggy Green May 01, 1975 [id number]        43 y.o.  [id number]   RP: Deep dyspareunia/Paragard IUD for Pelvic US  HPI: C/O uterine cramps and deep dyspareunia x last visit 08/20/2018.  Paragard IUD x 2018.  No abnormal vaginal discharge.  Menses normal every month.  No BTB.  No UTI Sx.  BMs normal.  No fever.   OB History  Gravida Para Term Preterm AB Living  2 2 2     2   SAB TAB Ectopic Multiple Live Births          2    # Outcome Date GA Lbr Len/2nd Weight Sex Delivery Anes PTL Lv  2 Term     M Vag-Spont  N LIV  1 Term     F Vag-Spont  N LIV    Past medical history,surgical history, problem list, medications, allergies, family history and social history were all reviewed and documented in the EPIC chart.   Directed ROS with pertinent positives and negatives documented in the history of present illness/assessment and plan.  Exam:  There were no vitals filed for this visit. General appearance:  Normal  Pelvic US today: T/V images.  Anteverted uterus measuring 8.51 x 5.37 x 4.21 cm.  Single intramural fibroid measuring 1.1 cm.  Symmetrical endometrial lining measuring 9.0 mm with no mass seen.  IUD is displaced into the cervical canal.  Both ovaries are normal in size with normal follicle or pattern and perfusion.  A left ovarian resolving corpus luteum cyst measuring 1.4 cm is present.  Patient is on cycle day #22.  No free fluid in the posterior cul-de-sac.  Abdomen: Normal  Gynecologic exam: Vulva normal.  Speculum:  Cervix/Vagina normal.  Normal secretions.  [facility]UD strings visible at [facility].  Grasped with a fenestrated clamp.  Easy removal of the Paragard IUD which was in the endocervical canal.  Complete, intact.  Shown to patient and discarded.   Assessment/Plan:  43 y.o. G2P2002   1. Deep dyspareunia Pelvic discomfort with cramping and deep dyspareunia associated with a migrated ParaGard IUD in the endocervical canal per ultrasound findings.  ParaGard IUD easily removed  complete and intact.  Pelvic ultrasound findings completely normal otherwise.  Findings reviewed with patient.  2. IUD migration, initial encounter [facility]) ParaGard IUD removed complete and intact due to migration in the endocervical canal.  Patient would like to have a new ParaGard IUD inserted, but is considering Mirena IUD as well.  Mirena IUD pamphlet given to patient.  Will schedule IUD insertion and decide which one between the ParaGard and the Mirena IUD she would prefer to have.  Declines other form of contraception.  3. Encounter for IUD removal Easy removal of displaced ParaGard IUD in the endocervical canal.  IUD was complete and intact.  Counseling on above issues and coordination of care more than 50% for 15 minutes.  Princess Bruins MD, 4:30 PM 10/04/2018

## 2018-10-04 NOTE — Patient Instructions (Signed)
1. Deep dyspareunia Pelvic discomfort with cramping and deep dyspareunia associated with a migrated ParaGard IUD in the endocervical canal per ultrasound findings.  ParaGard IUD easily removed complete and intact.  Pelvic ultrasound findings completely normal otherwise.  Findings reviewed with patient.  2. IUD migration, initial encounter [facility]) ParaGard IUD removed complete and intact due to migration in the endocervical canal.  Patient would like to have a new ParaGard IUD inserted, but is considering Mirena IUD as well.  Mirena IUD pamphlet given to patient.  Will schedule IUD insertion and decide which one between the ParaGard and the Mirena IUD she would prefer to have.  Declines other form of contraception.  3. Encounter for IUD removal Easy removal of displaced ParaGard IUD in the endocervical canal.  IUD was complete and intact.  Mahli, it was a pleasure seeing you today!

## 2018-11-01 ENCOUNTER — Ambulatory Visit: Payer: 59 | Admitting: Obstetrics & Gynecology

## 2018-12-26 ENCOUNTER — Telehealth: Payer: Self-pay

## 2018-12-26 NOTE — Telephone Encounter (Signed)
Patient has decided she would like to have tubal ligation.

## 2018-12-27 NOTE — Telephone Encounter (Signed)
Schedule a preop visit with me to discuss and schedule surgery.

## 2018-12-28 NOTE — Telephone Encounter (Signed)
Patient advised. She asked if I would check her insurance benefits first so that she could see if financially feasible prior to scheduling pre op appt.  I will call her back with this information.

## 2018-12-28 NOTE — Telephone Encounter (Signed)
Spoke with patient and informed her of benefits. Covered at 100% under preventative per Sam/call ref# [id number].  Patient wants to check her schedule and will schedule appointment with Dr. Marguerita Merles for pre op.

## 2019-05-06 ENCOUNTER — Other Ambulatory Visit: Payer: Self-pay

## 2019-05-07 ENCOUNTER — Ambulatory Visit (INDEPENDENT_AMBULATORY_CARE_PROVIDER_SITE_OTHER): Payer: 59 | Admitting: Obstetrics & Gynecology

## 2019-05-07 ENCOUNTER — Encounter: Payer: Self-pay | Admitting: Obstetrics & Gynecology

## 2019-05-07 VITALS — BP 124/82 | Ht 60.0 in | Wt 193.0 lb

## 2019-05-07 DIAGNOSIS — Z01419 Encounter for gynecological examination (general) (routine) without abnormal findings: Secondary | ICD-10-CM | POA: Diagnosis not present

## 2019-05-07 DIAGNOSIS — Z302 Encounter for sterilization: Secondary | ICD-10-CM | POA: Diagnosis not present

## 2019-05-07 DIAGNOSIS — Z113 Encounter for screening for infections with a predominantly sexual mode of transmission: Secondary | ICD-10-CM

## 2019-05-07 DIAGNOSIS — N3090 Cystitis, unspecified without hematuria: Secondary | ICD-10-CM

## 2019-05-07 DIAGNOSIS — Z3009 Encounter for other general counseling and advice on contraception: Secondary | ICD-10-CM

## 2019-05-07 DIAGNOSIS — Z6837 Body mass index (BMI) 37.0-37.9, adult: Secondary | ICD-10-CM | POA: Diagnosis not present

## 2019-05-07 DIAGNOSIS — E6609 Other obesity due to excess calories: Secondary | ICD-10-CM

## 2019-05-07 DIAGNOSIS — Z635 Disruption of family by separation and divorce: Secondary | ICD-10-CM | POA: Diagnosis not present

## 2019-05-07 NOTE — Progress Notes (Signed)
Peggy Green May 02, 1975 [id number]   History:    44 y.o. G2P2L2 Married, in process of separation.  Daughter 63 yo, son 19 yo.  RP:  Established patient presenting for annual gyn exam   HPI: Normal menstrual periods every month.  ParaGard IUD migrated in cervix, removed 09/2018.  No breakthrough bleeding.  No pelvic pain.  No pain with intercourse, but not currently sexually active, as she is separating.  Requesting a Bilateral Tubal Ligation.  Certain to desire a sterilization procedure.  Normal vaginal secretions.  Urine and bowel movements normal.  Frequent bladder infections.  Breasts normal.  Body mass increase since last year now at 37.69.  Patient is now on phentermine and beta-hCG injections.  Low calorie nutrition and increased fitness activities.  We will follow-up here for fasting health labs.  Past medical history,surgical history, family history and social history were all reviewed and documented in the EPIC chart.  Gynecologic History Patient's last menstrual period was 04/27/2019.  Obstetric History OB History  Gravida Para Term Preterm AB Living  _0 SAB TAB Ectopic Multiple Live Births          2    # Outcome Date GA Lbr Len/2nd Weight Sex Delivery Anes PTL Lv  2 Term     M Vag-Spont  N LIV  1 Term     F Vag-Spont  N LIV     ROS: A ROS was performed and pertinent positives and negatives are included in the history.  GENERAL: No fevers or chills. HEENT: No change in vision, no earache, sore throat or sinus congestion. NECK: No pain or stiffness. CARDIOVASCULAR: No chest pain or pressure. No palpitations. PULMONARY: No shortness of breath, cough or wheeze. GASTROINTESTINAL: No abdominal pain, nausea, vomiting or diarrhea, melena or bright red blood per rectum. GENITOURINARY: No urinary frequency, urgency, hesitancy or dysuria. MUSCULOSKELETAL: No joint or muscle pain, no back pain, no recent trauma. DERMATOLOGIC: No rash, no itching, no lesions. ENDOCRINE: No  polyuria, polydipsia, no heat or cold intolerance. No recent change in weight. HEMATOLOGICAL: No anemia or easy bruising or bleeding. NEUROLOGIC: No headache, seizures, numbness, tingling or weakness. PSYCHIATRIC: No depression, no loss of interest in normal activity or change in sleep pattern.     Exam:   BP 124/82 (BP Location: Right Arm, Patient Position: Sitting, Cuff Size: Normal)   Ht 5' (1.524 m)   Wt 193 lb (87.5 kg)   LMP 04/27/2019   BMI 37.69 kg/m   Body mass index is 37.69 kg/m.  General appearance : Well developed well nourished female. No acute distress HEENT: Eyes: no retinal hemorrhage or exudates,  Neck supple, trachea midline, no carotid bruits, no thyroidmegaly Lungs: Clear to auscultation, no rhonchi or wheezes, or rib retractions  Heart: Regular rate and rhythm, no murmurs or gallops Breast:Examined in sitting and supine position were symmetrical in appearance, no palpable masses or tenderness,  no skin retraction, no nipple inversion, no nipple discharge, no skin discoloration, no axillary or supraclavicular lymphadenopathy Abdomen: no palpable masses or tenderness, no rebound or guarding Extremities: no edema or skin discoloration or tenderness  Pelvic: Vulva: Normal             Vagina: No gross lesions or discharge  Cervix: No gross lesions or discharge.  Pap/HPV HR, Gono-Chlam done  Uterus  AV,  normal size, shape and consistency, non-tender and mobile  Adnexa  Without masses or tenderness  Anus: Normal  U/A: Yellow clear, protein negative, nitrite negative, white blood cells 0-5, red blood cells 10-20, few bacteria.  Urine culture pending.   Assessment/Plan:  44 y.o. female for annual exam   1. Encounter for routine gynecological examination with Papanicolaou smear of cervix Normal gynecologic exam.  Pap test with high-risk HPV done today.  Breast exam normal.  Screening mammogram negative in June 2020.  We will follow-up here for fasting health labs. -  CBC; Future - Comp Met (CMET); Future - TSH; Future - Lipid panel; Future - VITAMIN D 25 Hydroxy (Vit-D Deficiency, Fractures); Future  2. Consultation for female sterilization Patient is 41 in the process of separation she has 2 healthy children who are 7 and 55 year old.  She is certain to desire a sterilization procedure.  Laparoscopy bilateral tubal sterilization by catheterization reviewed.  Information given.  Follow-up preop visit in person or by televisit.  3. Screen for STD (sexually transmitted disease) Strongly recommend condom use if sexually active. -Gonorrhea and chlamydia on the Pap test - HIV antibody (with reflex); Future - RPR; Future - Hepatitis C Antibody; Future - Hepatitis B Surface AntiGEN; Future  4. Recurrent cystitis Urine analysis only mildly abnormal.  Probably no acute cystitis.  Pending urine culture.  Will treat if urine culture is positive. - Urinalysis,Complete w/RFL Culture  5. Class 2 obesity due to excess calories without serious comorbidity with body mass index (BMI) of 37.0 to 37.9 in adult Recommend a lower calorie/carb diet such as [employer].  Aerobic physical activities 5 times a week and light weightlifting every 2 days.  Would like to start weight loss with phentermine.  No contraindication.  Usage reviewed and prescription sent to pharmacy.  Other orders - phentermine 37.5 MG capsule; Take 37.5 mg by mouth every morning.  Princess Bruins MD, 2:28 PM 05/07/2019

## 2019-05-09 ENCOUNTER — Encounter: Payer: Self-pay | Admitting: Obstetrics & Gynecology

## 2019-05-09 LAB — PAP IG, CT-NG NAA, HPV HIGH-RISK
C. trachomatis RNA, TMA: NOT DETECTED
HPV DNA High Risk: NOT DETECTED
N. gonorrhoeae RNA, TMA: NOT DETECTED

## 2019-05-09 LAB — URINE CULTURE
MICRO NUMBER:: 10155773
Result:: NO GROWTH
SPECIMEN QUALITY:: ADEQUATE

## 2019-05-09 LAB — URINALYSIS, COMPLETE W/RFL CULTURE
Bilirubin Urine: NEGATIVE
Glucose, UA: NEGATIVE
Hyaline Cast: NONE SEEN /LPF
Leukocyte Esterase: NEGATIVE
Nitrites, Initial: NEGATIVE
Protein, ur: NEGATIVE
Specific Gravity, Urine: 1.025 (ref 1.001–1.03)
pH: 6 (ref 5.0–8.0)

## 2019-05-09 LAB — CULTURE INDICATED

## 2019-05-09 NOTE — Patient Instructions (Signed)
  1. Encounter for routine gynecological examination with Papanicolaou smear of cervix Normal gynecologic exam.  Pap test with high-risk HPV done today.  Breast exam normal.  Screening mammogram negative in June 2020.  We will follow-up here for fasting health labs. - CBC; Future - Comp Met (CMET); Future - TSH; Future - Lipid panel; Future - VITAMIN D 25 Hydroxy (Vit-D Deficiency, Fractures); Future  2. Consultation for female sterilization Patient is 52 in the process of separation she has 2 healthy children who are 23 and 13 year old.  She is certain to desire a sterilization procedure.  Laparoscopy bilateral tubal sterilization by catheterization reviewed.  Information given.  Follow-up preop visit in person or by televisit.  3. Screen for STD (sexually transmitted disease) Strongly recommend condom use if sexually active. -Gonorrhea and chlamydia on the Pap test - HIV antibody (with reflex); Future - RPR; Future - Hepatitis C Antibody; Future - Hepatitis B Surface AntiGEN; Future  4. Recurrent cystitis Urine analysis only mildly abnormal.  Probably no acute cystitis.  Pending urine culture.  Will treat if urine culture is positive. - Urinalysis,Complete w/RFL Culture  5. Class 2 obesity due to excess calories without serious comorbidity with body mass index (BMI) of 37.0 to 37.9 in adult Recommend a lower calorie/carb diet such as [employer].  Aerobic physical activities 5 times a week and light weightlifting every 2 days.  Would like to start weight loss with phentermine.  No contraindication.  Usage reviewed and prescription sent to pharmacy.  Other orders - phentermine 37.5 MG capsule; Take 37.5 mg by mouth every morning.  Miela, fue un placer verle hoy!  Voy a informarle de sus [employer].

## 2019-05-13 ENCOUNTER — Other Ambulatory Visit: Payer: 59

## 2019-05-13 ENCOUNTER — Other Ambulatory Visit: Payer: Self-pay

## 2019-05-13 ENCOUNTER — Telehealth: Payer: Self-pay

## 2019-05-13 DIAGNOSIS — Z01419 Encounter for gynecological examination (general) (routine) without abnormal findings: Secondary | ICD-10-CM

## 2019-05-13 DIAGNOSIS — Z113 Encounter for screening for infections with a predominantly sexual mode of transmission: Secondary | ICD-10-CM

## 2019-05-13 NOTE — Telephone Encounter (Signed)
I called patient to discuss scheduling lap tubal sterilization.  I reviewed that ins co told me that it "has a preventative indicator and is covered at 100% with no patient responsibility".  She asked me did that mean anesthesia bill to. I told her that I thought that it did and in the past when sterilization is covered this way it does but no way I can guarantee that. I provided her with the CPT code and advised her to call her ins co for specifics to be reassured.  We discussed need for Covid screen before surgery and that was scheduled accordingly and quarantine protocol reviewed.   Pre op visit was scheduled. Packet mailed to the patient.

## 2019-05-14 LAB — COMPREHENSIVE METABOLIC PANEL
AG Ratio: 1.5 (calc) (ref 1.0–2.5)
ALT: 14 U/L (ref 6–29)
AST: 12 U/L (ref 10–30)
Albumin: 4 g/dL (ref 3.6–5.1)
Alkaline phosphatase (APISO): 61 U/L (ref 31–125)
BUN: 13 mg/dL (ref 7–25)
CO2: 25 mmol/L (ref 20–32)
Calcium: 9.4 mg/dL (ref 8.6–10.2)
Chloride: 105 mmol/L (ref 98–110)
Creat: 0.75 mg/dL (ref 0.50–1.10)
Globulin: 2.7 g/dL (calc) (ref 1.9–3.7)
Glucose, Bld: 87 mg/dL (ref 65–99)
Potassium: 4.1 mmol/L (ref 3.5–5.3)
Sodium: 137 mmol/L (ref 135–146)
Total Bilirubin: 0.5 mg/dL (ref 0.2–1.2)
Total Protein: 6.7 g/dL (ref 6.1–8.1)

## 2019-05-14 LAB — TSH: TSH: 2.37 mIU/L

## 2019-05-14 LAB — CBC
HCT: 38.6 % (ref 35.0–45.0)
Hemoglobin: 12.5 g/dL (ref 11.7–15.5)
MCH: 29.7 pg (ref 27.0–33.0)
MCHC: 32.4 g/dL (ref 32.0–36.0)
MCV: 91.7 fL (ref 80.0–100.0)
MPV: 11 fL (ref 7.5–12.5)
Platelets: 307 10*3/uL (ref 140–400)
RBC: 4.21 10*6/uL (ref 3.80–5.10)
RDW: 12.8 % (ref 11.0–15.0)
WBC: 5.9 10*3/uL (ref 3.8–10.8)

## 2019-05-14 LAB — LIPID PANEL
Cholesterol: 200 mg/dL — ABNORMAL HIGH (ref <200)
HDL: 63 mg/dL (ref >=50)
LDL Cholesterol (Calc): 119 mg/dL (calc) — ABNORMAL HIGH
Non-HDL Cholesterol (Calc): 137 mg/dL (calc) — ABNORMAL HIGH (ref <130)
Total CHOL/HDL Ratio: 3.2 (calc) (ref <5.0)
Triglycerides: 81 mg/dL (ref <150)

## 2019-05-14 LAB — RPR: RPR Ser Ql: NONREACTIVE

## 2019-05-14 LAB — VITAMIN D 25 HYDROXY (VIT D DEFICIENCY, FRACTURES): Vit D, 25-Hydroxy: 18 ng/mL — ABNORMAL LOW (ref 30–100)

## 2019-05-14 LAB — HEPATITIS C ANTIBODY
Hepatitis C Ab: NONREACTIVE
SIGNAL TO CUT-OFF: 0.01 (ref <1.00)

## 2019-05-14 LAB — HEPATITIS B SURFACE ANTIGEN: Hepatitis B Surface Ag: NONREACTIVE

## 2019-05-14 LAB — HIV ANTIBODY (ROUTINE TESTING W REFLEX): HIV 1&2 Ab, 4th Generation: NONREACTIVE

## 2019-05-21 ENCOUNTER — Telehealth: Payer: Self-pay

## 2019-05-21 NOTE — Telephone Encounter (Signed)
Left message with patient that she unread My Chart email about lab results. Asked her to either login and read it or give me a call and I can read it to her.  "STD screen is Negative.   Vitamin D level is low at 18. Recommend to take prescription strength Vitamin D 50,000 units one weekly x 12 weeks. Once you finish taking the prescription you need to return to the office for blood level to be rechecked. I will put an order on file and you just call for a lab appointment. Please let me know what pharmacy to send the prescription to.   LDL increased at 119, but Total Cholest/HDL good at 3.2. Low "bad cholesterol" diet.  TSH (thyroid) is normal.   Comprehensive metabolic panel is normal.   Ccmplete blood count is normal.   Again, just let me know what pharmacy to send the Vitamin D prescription to."

## 2019-05-21 NOTE — Telephone Encounter (Signed)
Left message regarding unread My Chart message about lab results.  I asked her to login and read it or give me a call and I will read it to her. See telephone encounter.

## 2019-06-03 ENCOUNTER — Other Ambulatory Visit: Payer: Self-pay

## 2019-06-03 ENCOUNTER — Encounter: Payer: Self-pay | Admitting: Obstetrics & Gynecology

## 2019-06-03 ENCOUNTER — Ambulatory Visit (INDEPENDENT_AMBULATORY_CARE_PROVIDER_SITE_OTHER): Payer: 59 | Admitting: Obstetrics & Gynecology

## 2019-06-03 DIAGNOSIS — Z3009 Encounter for other general counseling and advice on contraception: Secondary | ICD-10-CM

## 2019-06-03 DIAGNOSIS — E559 Vitamin D deficiency, unspecified: Secondary | ICD-10-CM

## 2019-06-03 MED ORDER — VITAMIN D (ERGOCALCIFEROL) 1.25 MG (50000 UNIT) PO CAPS: 50000.0000 [IU] | ORAL_CAPSULE | ORAL | 0 refills | Status: DC

## 2019-06-03 NOTE — Progress Notes (Signed)
    Peggy Green Nov 10, 1975 [id number]        44 y.o.  [id number] Divorced.  2 healthy children.  Televisit by phone.  RP: Preop LPS Bilateral Tubal Sterilization  HPI: Patient is divorced with 2 healthy children. She is certain to desire a sterilization procedure.   OB History  Gravida Para Term Preterm AB Living  2 2 2     2   SAB TAB Ectopic Multiple Live Births          2    # Outcome Date GA Lbr Len/2nd Weight Sex Delivery Anes PTL Lv  2 Term     M Vag-Spont  N LIV  1 Term     F Vag-Spont  N LIV    Past medical history,surgical history, problem list, medications, allergies, family history and social history were all reviewed and documented in the EPIC chart.   Directed ROS with pertinent positives and negatives documented in the history of present illness/assessment and plan.  Exam:  There were no vitals filed for this visit. General appearance:  Normal  Gynecologic exam: See previous visit.   Assessment/Plan:  44 y.o. G2P2002   1. Consultation for female sterilization Decision to proceed with laparoscopy bilateral tubal sterilization. Preop counseling done today. Preop preparation, surgical procedure with risks and benefits as well as postop expectations and precautions thoroughly reviewed. Patient voiced understanding and agreement with plan.  Princess Bruins MD, 12:19 PM 06/03/2019

## 2019-06-03 NOTE — Telephone Encounter (Signed)
Spoke with patient and informed her of results/recommendations. Rx for Vit D sent.  Vit D lab order placed.

## 2019-06-04 ENCOUNTER — Other Ambulatory Visit: Payer: Self-pay

## 2019-06-04 ENCOUNTER — Encounter (HOSPITAL_BASED_OUTPATIENT_CLINIC_OR_DEPARTMENT_OTHER): Payer: Self-pay | Admitting: Obstetrics & Gynecology

## 2019-06-04 NOTE — Progress Notes (Signed)
Spoke w/ via phone for pre-op interview---[facility] needs dos----   Cbc, type and screen, urine preg            COVID test ------06-07-2019 1010 am Arrive at -------730 am 06-11-2019 NPO after ------midnight Medications to take morning of surgery -----none Diabetic medication -----n/a Patient Special Instructions -----patient stopped phentermine on 05-28-2019 Pre-Op special Istructions ----- Patient verbalized understanding of instructions that were given at this phone interview. Patient denies shortness of breath, chest pain, fever, cough a this phone interview.

## 2019-06-07 ENCOUNTER — Inpatient Hospital Stay (HOSPITAL_COMMUNITY)
Admission: RE | Admit: 2019-06-07 | Discharge: 2019-06-07 | Disposition: A | Discharge status: Home / Self Care | Payer: 59 | Source: Ambulatory Visit

## 2019-06-07 ENCOUNTER — Telehealth: Payer: Self-pay

## 2019-06-07 NOTE — Progress Notes (Signed)
Pt called and stated that she woke up with a fever and chills. Pt states she wants to cancel her covid appointment for today. Pt denies going to get tested d/t symptoms elsewhere and states she will not be getting tested elsewhere. Advised pt to call surgeon's office and have procedure rescheduled. Pt verbalized understanding. Covid appointment cancelled for today.  Jacqlyn Larsen, RN

## 2019-06-07 NOTE — Telephone Encounter (Signed)
Patient called. Sick with vomiting and fever and chills. Cancelled her pre op Covid screen for today and wants to cancel surgery for Tuesday. Tentatively r/s'd it until May 21 but we will talk next week when she feels like looking at her calendar and discussing.

## 2019-06-09 ENCOUNTER — Encounter: Payer: Self-pay | Admitting: Obstetrics & Gynecology

## 2019-06-09 NOTE — Patient Instructions (Signed)
1. Consultation for female sterilization Decision to proceed with laparoscopy bilateral tubal sterilization. Preop counseling done today. Preop preparation, surgical procedure with risks and benefits as well as postop expectations and precautions thoroughly reviewed. Patient voiced understanding and agreement with plan.  Peggy Green, it was a pleasure seeing you today!

## 2019-06-25 ENCOUNTER — Telehealth: Payer: Self-pay

## 2019-06-25 NOTE — Telephone Encounter (Signed)
Left message for patient to call me

## 2019-07-02 ENCOUNTER — Telehealth: Payer: Self-pay

## 2019-07-02 NOTE — Telephone Encounter (Signed)
I called patient to talk with her about lap BTL that she cancelled awhile back but had me re-scheduled to 08/09/19.  We never firmed that up and Dr. Marguerita Merles had asked me to talk with her and ask her to be [phone] certain that this permanent surgery for sterilization is what she wants to do. I told her there are other options that are highly effective at preventing pregnancy without the permanence and Dr. Marguerita Merles said she would be happy to video visit and discuss with her. Patient said she definitely wants the permanent sterilization as she is 44yo and separating from husband. She has two grown daughters and does not want more children. She said her only question is if the time is right for surgery while separating and such. She wants to think about it some more and said she will call me back by the end of the week with her decision.

## 2019-07-11 NOTE — Telephone Encounter (Signed)
I called patient and left message that I really need her to call me with her decision about surgery as the OR time could be used for another patient if she does not want to do it right now. Not meaning to rush her but she does need to make a decision.

## 2019-07-24 NOTE — Telephone Encounter (Signed)
Left another voice mail message today asking patient to please call me as the 5/21 surgery date, she is tentatively scheduled for, is drawing close. I explained that I still need to schedule her pre op appt and Covid screen if she plans to move forward with surgery. If she does not want to have surgery I could really use the time slot for other patients needing surgery.  I asked her to call me as soon as possible and let me know.

## 2019-07-29 NOTE — Telephone Encounter (Signed)
I called patient this morning and spoke with her. She said she received none of my messages. She definitely wants surgery on 08/09/19 as we had tentatively rescheduled her at the time she cancelled before due to illness. Covid test was rescheduled.  Dr. Dellis Filbert, patient had pre op appt with you on 06/03/19, prior to cancelling lap tubal due to illness.  Are you comfortable with that or will she need another visit?

## 2019-07-29 NOTE — Telephone Encounter (Signed)
No need for another preop if patient is comfortable with it.

## 2019-08-02 ENCOUNTER — Encounter (HOSPITAL_BASED_OUTPATIENT_CLINIC_OR_DEPARTMENT_OTHER): Payer: Self-pay | Admitting: Obstetrics & Gynecology

## 2019-08-02 ENCOUNTER — Other Ambulatory Visit: Payer: Self-pay

## 2019-08-02 NOTE — Progress Notes (Signed)
Spoke w/ via phone for pre-op interview--- PT Lab needs dos--- CBC, T&S, Urine preg            Lab results------ no COVID test ------ 08-06-2019 @ [id number] Arrive at ------- 1000 NPO after ------ MN Medications to take morning of surgery ----- NONE Diabetic medication ----- n/a Patient Special Instructions ----- n/a Pre-Op special Istructions ----- n/a Patient verbalized understanding of instructions that were given at this phone interview. Patient denies shortness of breath, chest pain, fever, cough a this phone interview.

## 2019-08-06 ENCOUNTER — Other Ambulatory Visit (HOSPITAL_COMMUNITY)
Admission: RE | Admit: 2019-08-06 | Discharge: 2019-08-06 | Disposition: A | Discharge status: Home / Self Care | Payer: 59 | Source: Ambulatory Visit | Attending: Obstetrics & Gynecology | Admitting: Obstetrics & Gynecology

## 2019-08-06 DIAGNOSIS — Z20822 Contact with and (suspected) exposure to covid-19: Secondary | ICD-10-CM | POA: Diagnosis not present

## 2019-08-06 DIAGNOSIS — Z01812 Encounter for preprocedural laboratory examination: Secondary | ICD-10-CM | POA: Diagnosis not present

## 2019-08-06 LAB — SARS CORONAVIRUS 2 (TAT 6-24 HRS): SARS Coronavirus 2: NEGATIVE

## 2019-08-09 ENCOUNTER — Ambulatory Visit (HOSPITAL_BASED_OUTPATIENT_CLINIC_OR_DEPARTMENT_OTHER): Payer: 59 | Admitting: Anesthesiology

## 2019-08-09 ENCOUNTER — Encounter (HOSPITAL_BASED_OUTPATIENT_CLINIC_OR_DEPARTMENT_OTHER)
Admission: RE | Disposition: A | Discharge status: Home / Self Care | Payer: Self-pay | Source: Home / Self Care | Attending: Obstetrics & Gynecology

## 2019-08-09 ENCOUNTER — Ambulatory Visit (HOSPITAL_BASED_OUTPATIENT_CLINIC_OR_DEPARTMENT_OTHER)
Admission: RE | Admit: 2019-08-09 | Discharge: 2019-08-09 | Disposition: A | Discharge status: Home / Self Care | Payer: 59 | Attending: Obstetrics & Gynecology | Admitting: Obstetrics & Gynecology

## 2019-08-09 ENCOUNTER — Encounter (HOSPITAL_BASED_OUTPATIENT_CLINIC_OR_DEPARTMENT_OTHER): Payer: Self-pay | Admitting: Obstetrics & Gynecology

## 2019-08-09 DIAGNOSIS — E669 Obesity, unspecified: Secondary | ICD-10-CM | POA: Insufficient documentation

## 2019-08-09 DIAGNOSIS — Z302 Encounter for sterilization: Secondary | ICD-10-CM | POA: Insufficient documentation

## 2019-08-09 DIAGNOSIS — Z79899 Other long term (current) drug therapy: Secondary | ICD-10-CM | POA: Insufficient documentation

## 2019-08-09 DIAGNOSIS — Z6839 Body mass index (BMI) 39.0-39.9, adult: Secondary | ICD-10-CM | POA: Insufficient documentation

## 2019-08-09 DIAGNOSIS — E559 Vitamin D deficiency, unspecified: Secondary | ICD-10-CM | POA: Insufficient documentation

## 2019-08-09 HISTORY — DX: Stress incontinence (female) (male): N39.3

## 2019-08-09 HISTORY — DX: Personal history of cervical dysplasia: Z87.410

## 2019-08-09 HISTORY — DX: Dorsalgia, unspecified: M54.9

## 2019-08-09 HISTORY — PX: LAPAROSCOPIC TUBAL LIGATION: SHX1937

## 2019-08-09 HISTORY — DX: Vitamin D deficiency, unspecified: E55.9

## 2019-08-09 LAB — CBC
HCT: 38.8 % (ref 36.0–46.0)
Hemoglobin: 12.3 g/dL (ref 12.0–15.0)
MCH: 29.6 pg (ref 26.0–34.0)
MCHC: 31.7 g/dL (ref 30.0–36.0)
MCV: 93.5 fL (ref 80.0–100.0)
Platelets: 327 10*3/uL (ref 150–400)
RBC: 4.15 MIL/uL (ref 3.87–5.11)
RDW: 13.6 % (ref 11.5–15.5)
WBC: 6.2 10*3/uL (ref 4.0–10.5)
nRBC: 0 % (ref 0.0–0.2)

## 2019-08-09 LAB — TYPE AND SCREEN
ABO/RH(D): A POS
Antibody Screen: NEGATIVE

## 2019-08-09 LAB — POCT PREGNANCY, URINE
Preg Test, Ur: NEGATIVE
Preg Test, Ur: NEGATIVE

## 2019-08-09 LAB — ABO/RH: ABO/RH(D): A POS

## 2019-08-09 SURGERY — LIGATION, FALLOPIAN TUBE, LAPAROSCOPIC
Anesthesia: General | Site: Abdomen | Laterality: Bilateral

## 2019-08-09 MED ORDER — OXYCODONE HCL 5 MG/5ML PO SOLN: 5.0000 mg | Freq: Once | ORAL | Status: AC | PRN

## 2019-08-09 MED ORDER — CEFAZOLIN SODIUM-DEXTROSE 2-4 GM/100ML-% IV SOLN
2.0000 g | INTRAVENOUS | Status: AC
  Administered 2019-08-09: 2 g via INTRAVENOUS

## 2019-08-09 MED ORDER — KETOROLAC TROMETHAMINE 30 MG/ML IJ SOLN
INTRAMUSCULAR | Status: AC
  Filled 2019-08-09: qty 1

## 2019-08-09 MED ORDER — ONDANSETRON HCL 4 MG/2ML IJ SOLN
INTRAMUSCULAR | Status: AC
  Filled 2019-08-09: qty 2

## 2019-08-09 MED ORDER — FENTANYL CITRATE (PF) 100 MCG/2ML IJ SOLN
INTRAMUSCULAR | Status: DC | PRN
  Administered 2019-08-09 (×2): 50 ug via INTRAVENOUS

## 2019-08-09 MED ORDER — PROPOFOL 10 MG/ML IV BOLUS
INTRAVENOUS | Status: AC
  Filled 2019-08-09: qty 20

## 2019-08-09 MED ORDER — MIDAZOLAM HCL 5 MG/5ML IJ SOLN
INTRAMUSCULAR | Status: DC | PRN
  Administered 2019-08-09: 2 mg via INTRAVENOUS

## 2019-08-09 MED ORDER — CEFAZOLIN SODIUM-DEXTROSE 2-4 GM/100ML-% IV SOLN
INTRAVENOUS | Status: AC
  Filled 2019-08-09: qty 100

## 2019-08-09 MED ORDER — DEXAMETHASONE SODIUM PHOSPHATE 10 MG/ML IJ SOLN
INTRAMUSCULAR | Status: DC | PRN
  Administered 2019-08-09: 10 mg via INTRAVENOUS

## 2019-08-09 MED ORDER — ROCURONIUM BROMIDE 10 MG/ML (PF) SYRINGE
PREFILLED_SYRINGE | INTRAVENOUS | Status: DC | PRN
  Administered 2019-08-09: 50 mg via INTRAVENOUS

## 2019-08-09 MED ORDER — ONDANSETRON HCL 4 MG/2ML IJ SOLN
INTRAMUSCULAR | Status: DC | PRN
  Administered 2019-08-09: 4 mg via INTRAVENOUS

## 2019-08-09 MED ORDER — DEXAMETHASONE SODIUM PHOSPHATE 10 MG/ML IJ SOLN
INTRAMUSCULAR | Status: AC
  Filled 2019-08-09: qty 1

## 2019-08-09 MED ORDER — OXYCODONE HCL 5 MG PO TABS
5.0000 mg | ORAL_TABLET | Freq: Once | ORAL | Status: AC | PRN
  Administered 2019-08-09: 5 mg via ORAL

## 2019-08-09 MED ORDER — LACTATED RINGERS IV SOLN: INTRAVENOUS | Status: DC

## 2019-08-09 MED ORDER — LIDOCAINE 2% (20 MG/ML) 5 ML SYRINGE
INTRAMUSCULAR | Status: AC
  Filled 2019-08-09: qty 5

## 2019-08-09 MED ORDER — FENTANYL CITRATE (PF) 100 MCG/2ML IJ SOLN
INTRAMUSCULAR | Status: AC
  Filled 2019-08-09: qty 2

## 2019-08-09 MED ORDER — MIDAZOLAM HCL 2 MG/2ML IJ SOLN
INTRAMUSCULAR | Status: AC
  Filled 2019-08-09: qty 2

## 2019-08-09 MED ORDER — SUGAMMADEX SODIUM 200 MG/2ML IV SOLN
INTRAVENOUS | Status: DC | PRN
  Administered 2019-08-09: 200 mg via INTRAVENOUS

## 2019-08-09 MED ORDER — PROPOFOL 10 MG/ML IV BOLUS
INTRAVENOUS | Status: DC | PRN
  Administered 2019-08-09: 180 mg via INTRAVENOUS

## 2019-08-09 MED ORDER — KETOROLAC TROMETHAMINE 30 MG/ML IJ SOLN
30.0000 mg | Freq: Once | INTRAMUSCULAR | Status: AC | PRN
  Administered 2019-08-09: 30 mg via INTRAVENOUS

## 2019-08-09 MED ORDER — ROCURONIUM BROMIDE 10 MG/ML (PF) SYRINGE
PREFILLED_SYRINGE | INTRAVENOUS | Status: AC
  Filled 2019-08-09: qty 10

## 2019-08-09 MED ORDER — OXYCODONE HCL 5 MG PO TABS
ORAL_TABLET | ORAL | Status: AC
  Filled 2019-08-09: qty 1

## 2019-08-09 MED ORDER — FENTANYL CITRATE (PF) 100 MCG/2ML IJ SOLN: 25.0000 ug | INTRAMUSCULAR | Status: DC | PRN

## 2019-08-09 MED ORDER — LIDOCAINE 2% (20 MG/ML) 5 ML SYRINGE
INTRAMUSCULAR | Status: DC | PRN
  Administered 2019-08-09: 80 mg via INTRAVENOUS

## 2019-08-09 MED ORDER — BUPIVACAINE HCL (PF) 0.25 % IJ SOLN
INTRAMUSCULAR | Status: DC | PRN
  Administered 2019-08-09: 10 mL

## 2019-08-09 SURGICAL SUPPLY — 30 items
APPLICATOR COTTON TIP 6 STRL (MISCELLANEOUS) IMPLANT
APPLICATOR COTTON TIP 6IN STRL (MISCELLANEOUS)
CATH ROBINSON RED A/P 16FR (CATHETERS) ×3 IMPLANT
COVER WAND RF STERILE (DRAPES) ×3 IMPLANT
DERMABOND ADVANCED (GAUZE/BANDAGES/DRESSINGS) ×2
DERMABOND ADVANCED .7 DNX12 (GAUZE/BANDAGES/DRESSINGS) ×1 IMPLANT
DRSG OPSITE POSTOP 3X4 (GAUZE/BANDAGES/DRESSINGS) IMPLANT
DURAPREP 26ML APPLICATOR (WOUND CARE) ×3 IMPLANT
ELECT REM PT RETURN 9FT ADLT (ELECTROSURGICAL) ×3
ELECTRODE REM PT RTRN 9FT ADLT (ELECTROSURGICAL) ×1 IMPLANT
GAUZE 4X4 16PLY RFD (DISPOSABLE) ×3 IMPLANT
GLOVE BIO SURGEON STRL SZ 6.5 (GLOVE) ×2 IMPLANT
GLOVE BIO SURGEON STRL SZ7.5 (GLOVE) IMPLANT
GLOVE BIO SURGEONS STRL SZ 6.5 (GLOVE) ×1
GLOVE BIOGEL PI IND STRL 7.0 (GLOVE) ×2 IMPLANT
GLOVE BIOGEL PI INDICATOR 7.0 (GLOVE) ×4
GOWN STRL REUS W/TWL LRG LVL3 (GOWN DISPOSABLE) ×6 IMPLANT
GOWN STRL REUS W/TWL XL LVL3 (GOWN DISPOSABLE) IMPLANT
PACK LAPAROSCOPY BASIN (CUSTOM PROCEDURE TRAY) ×3 IMPLANT
PACK TRENDGUARD 450 HYBRID PRO (MISCELLANEOUS) IMPLANT
PAD OB MATERNITY 4.3X12.25 (PERSONAL CARE ITEMS) ×3 IMPLANT
PROTECTOR NERVE ULNAR (MISCELLANEOUS) IMPLANT
SET SUCTION IRRIG HYDROSURG (IRRIGATION / IRRIGATOR) IMPLANT
SUT MNCRL AB 4-0 PS2 18 (SUTURE) ×3 IMPLANT
SUT VICRYL 0 UR6 27IN ABS (SUTURE) ×3 IMPLANT
TOWEL OR 17X26 10 PK STRL BLUE (TOWEL DISPOSABLE) ×3 IMPLANT
TRENDGUARD 450 HYBRID PRO PACK (MISCELLANEOUS)
TROCAR BLADELESS OPT 5 100 (ENDOMECHANICALS) IMPLANT
TROCAR XCEL BLUNT TIP 100MML (ENDOMECHANICALS) ×3 IMPLANT
WARMER LAPAROSCOPE (MISCELLANEOUS) ×3 IMPLANT

## 2019-08-09 NOTE — Anesthesia Preprocedure Evaluation (Signed)
Anesthesia Evaluation  Patient identified by MRN, date of birth, ID band Patient awake    Reviewed: Allergy & Precautions, NPO status , Patient's Chart, lab work & pertinent test results  Airway Mallampati: II  TM Distance: >3 FB Neck ROM: Full    Dental no notable dental hx.    Pulmonary neg pulmonary ROS,    Pulmonary exam normal breath sounds clear to auscultation       Cardiovascular negative cardio ROS Normal cardiovascular exam Rhythm:Regular Rate:Normal     Neuro/Psych negative neurological ROS  negative psych ROS   GI/Hepatic negative GI ROS, Neg liver ROS,   Endo/Other  obesity  Renal/GU negative Renal ROS  negative genitourinary   Musculoskeletal negative musculoskeletal ROS (+)   Abdominal   Peds negative pediatric ROS (+)  Hematology negative hematology ROS (+)   Anesthesia Other Findings   Reproductive/Obstetrics negative OB ROS                             Anesthesia Physical Anesthesia Plan  ASA: I  Anesthesia Plan: General   Post-op Pain Management:    Induction: Intravenous  PONV Risk Score and Plan: 3 and Ondansetron, Dexamethasone, Midazolam and Treatment may vary due to age or medical condition  Airway Management Planned: Oral ETT  Additional Equipment:   Intra-op Plan:   Post-operative Plan: Extubation in OR  Informed Consent: I have reviewed the patients History and Physical, chart, labs and discussed the procedure including the risks, benefits and alternatives for the proposed anesthesia with the patient or authorized representative who has indicated his/her understanding and acceptance.     Dental advisory given  Plan Discussed with: CRNA and Surgeon  Anesthesia Plan Comments:         Anesthesia Quick Evaluation

## 2019-08-09 NOTE — Op Note (Signed)
Operative Note  08/09/2019  11:28 AM  PATIENT:  Peggy Green  44 y.o. female  PRE-OPERATIVE DIAGNOSIS:  Desires sterilization  POST-OPERATIVE DIAGNOSIS:  Desires sterilization  PROCEDURE:  Procedure(s): LAPAROSCOPIC BILATERAL TUBAL LIGATION BY CAUTERIZATION  SURGEON:  Surgeon(s): Princess Bruins, MD  ANESTHESIA:   general  FINDINGS: Normal uterus, bilateral tubes and ovaries.  DESCRIPTION OF OPERATION: Under general anesthesia with laryngeal mask, the patient is in lithotomy position.  She is prepped with DuraPrep on the abdomen and with Betadine on the suprapubic, vulvar and vaginal areas.  She is draped as usual.  Timeout is done.  The bladder is catheterized.  The vaginal exam reveals an anteverted uterus, normal volume, mobile.  No adnexal mass.  The speculum is inserted in the vagina and the anterior lip of the cervix is grasped with a tenaculum.  The Hulka uterine cannula is put in place.  The other instruments are removed.  We go to the abdomen.      The infraumbilical area is infiltrated with Marcaine one quarter plain.  A 1.5 cm incision is made with the scalp L at that level.  The aponeurosis is grasped with cokers.  The aponeurosis is open with Mayo scissors under direct vision.  The parietal peritoneum is open bluntly with the finger.  A pursestring stitch of Vicryl 0 is done on the aponeurosis.  The Hossein is inserted under direct vision and up pneumoperitoneum is created.  The camera is inserted at that level.  Inspection of the abdominopelvic cavities reveals no pathology.  Pictures taken of the liver.  Pictures are taken of the uterus with bilateral tubes and ovaries.  We are using the operative scope, the bipolar clamp is inserted at that level.  The tubes are well identified with the fimbriated distally.  We cauterized the right tube at about 2 cm of the cornua then distal to that point and in the middle.  We include the mesosalpinx.  We proceeded exactly the same way on  the left side.  Hemostasis is adequate.  Pictures are taken after bilateral tubal sterilization with cauterization.  Laparoscopic instruments are removed.  The port is removed.  The CO2 is evacuated.  The pursestring stitch is attached at the aponeurosis.  We closed the skin with a subcuticular stitch of Vicryl 4-0.  Dermabond is added.  The Hulka cannula is removed from the uterus.  Hemostasis is adequate at that level as well.  The patient is brought to recovery room in good and stable status.  ESTIMATED BLOOD LOSS: 4 mL   Intake/Output Summary (Last 24 hours) at 08/09/2019 1128 Last data filed at 08/09/2019 1122 Gross per 24 hour  Intake -  Output 4 ml  Net -4 ml     BLOOD ADMINISTERED:none   LOCAL MEDICATIONS USED:  MARCAINE     SPECIMEN:  Source of Specimen:  None  DISPOSITION OF SPECIMEN:  N/A  COUNTS:  YES  PLAN OF CARE: Transfer to PACU  Marie-Lyne LavoieMD11:28 AM

## 2019-08-09 NOTE — Anesthesia Postprocedure Evaluation (Signed)
Anesthesia Post Note  Patient: Peggy Green  Procedure(s) Performed: LAPAROSCOPIC TUBAL LIGATION BY CAUTERIZATION (Bilateral Abdomen)     Patient location during evaluation: PACU Anesthesia Type: General Level of consciousness: awake and alert Pain management: pain level controlled Vital Signs Assessment: post-procedure vital signs reviewed and stable Respiratory status: spontaneous breathing, nonlabored ventilation, respiratory function stable and patient connected to nasal cannula oxygen Cardiovascular status: blood pressure returned to baseline and stable Postop Assessment: no apparent nausea or vomiting Anesthetic complications: no    Last Vitals:  Vitals:   08/09/19 1200 08/09/19 1215  BP: 95/63 93/62  Pulse: (!) 52 (!) 50  Resp: 14 14  Temp:    SpO2: 95% 95%    Last Pain:  Vitals:   08/09/19 1200  TempSrc:   PainSc: 6                  Tondalaya Perren S

## 2019-08-09 NOTE — Transfer of Care (Signed)
Immediate Anesthesia Transfer of Care Note  Patient: Peggy Green  Procedure(s) Performed: Procedure(s): LAPAROSCOPIC TUBAL LIGATION BY CAUTERIZATION (Bilateral)  Patient Location: PACU  Anesthesia Type:General  Level of Consciousness: Alert, Awake, Oriented  Airway & Oxygen Therapy: Patient Spontanous Breathing  Post-op Assessment: Report given to RN  Post vital signs: Reviewed and stable  Last Vitals:  Vitals:   08/09/19 0951 08/09/19 1134  BP: 130/80 100/64  Pulse: 69 81  Resp: 18 (!) 9  Temp: 36.8 C (!) 36.3 C  SpO2: [id number] [id number]    Complications: No apparent anesthesia complications

## 2019-08-09 NOTE — Anesthesia Procedure Notes (Signed)
Procedure Name: Intubation Date/Time: 08/09/2019 10:44 AM Performed by: Gerald Leitz, CRNA Pre-anesthesia Checklist: Patient identified, Patient being monitored, Timeout performed, Emergency Drugs available and Suction available Patient Re-evaluated:Patient Re-evaluated prior to induction Oxygen Delivery Method: Circle system utilized Preoxygenation: Pre-oxygenation with 100% oxygen Induction Type: IV induction Ventilation: Mask ventilation without difficulty Laryngoscope Size: Mac and 3 Grade View: Grade I Tube type: Oral Tube size: 7.0 mm Number of attempts: 1 Placement Confirmation: ETT inserted through vocal cords under direct vision,  positive ETCO2 and breath sounds checked- equal and bilateral Secured at: 21 cm Tube secured with: Tape Dental Injury: Teeth and Oropharynx as per pre-operative assessment

## 2019-08-09 NOTE — Discharge Instructions (Addendum)
Laparoscopic Tubal Ligation, Care After This sheet gives you information about how to care for yourself after your procedure. Your health care provider may also give you more specific instructions. If you have problems or questions, contact your health care provider. What can I expect after the procedure? After the procedure, it is common to have:  A sore throat.  Discomfort in your shoulder.  Mild discomfort or cramping in your abdomen.  Gas pains.  Pain or soreness in the area where the surgical incision was made.  A bloated feeling.  Tiredness.  Nausea.  Vomiting. Follow these instructions at home: Medicines  Take over-the-counter and prescription medicines only as told by your health care provider.  Do not take aspirin because it can cause bleeding.  Ask your health care provider if the medicine prescribed to you: ? Requires you to avoid driving or using heavy machinery. ? Can cause constipation. You may need to take actions to prevent or treat constipation, such as:  Drink enough fluid to keep your urine pale yellow.  Take over-the-counter or prescription medicines.  Eat foods that are high in fiber, such as beans, whole grains, and fresh fruits and vegetables.  Limit foods that are high in fat and processed sugars, such as fried or sweet foods. Incision care      Follow instructions from your health care provider about how to take care of your incision. Make sure you: ? Wash your hands with soap and water before and after you change your bandage (dressing). If soap and water are not available, use hand sanitizer. ? Change your dressing as told by your health care provider. ? Leave stitches (sutures), skin glue, or adhesive strips in place. These skin closures may need to stay in place for 2 weeks or longer. If adhesive strip edges start to loosen and curl up, you may trim the loose edges. Do not remove adhesive strips completely unless your health care provider  tells you to do that.  Check your incision area every day for signs of infection. Check for: ? Redness, swelling, or pain. ? Fluid or blood. ? Warmth. ? Pus or a bad smell. Activity  Rest as told by your health care provider.  Avoid sitting for a long time without moving. Get up to take short walks every 1-2 hours. This is important to improve blood flow and breathing. Ask for help if you feel weak or unsteady.  Return to your normal activities as told by your health care provider. Ask your health care provider what activities are safe for you. General instructions  Do not take baths, swim, or use a hot tub until your health care provider approves. Ask your health care provider if you may take showers. You may only be allowed to take sponge baths.  Have someone help you with your daily household tasks for the first few days.  Keep all follow-up visits as told by your health care provider. This is important. Contact a health care provider if:  You have redness, swelling, or pain around your incision.  Your incision feels warm to the touch.  You have pus or a bad smell coming from your incision.  The edges of your incision break open after the sutures have been removed.  Your pain does not improve after 2-3 days.  You have a rash.  You repeatedly become dizzy or light-headed.  Your pain medicine is not helping. Get help right away if you:  Have a fever.  Faint.  Have increasing   pain in your abdomen.  Have severe pain in one or both of your shoulders.  Have fluid or blood coming from your sutures or from your vagina.  Have shortness of breath or difficulty breathing.  Have chest pain or leg pain.  Have ongoing nausea, vomiting, or diarrhea. Summary  After the procedure, it is common to have mild discomfort or cramping in your abdomen.  Take over-the-counter and prescription medicines only as told by your health care provider.  Watch for symptoms that should  prompt you to call your health care provider.  Keep all follow-up visits as told by your health care provider. This is important. This information is not intended to replace advice given to you by your health care provider. Make sure you discuss any questions you have with your health care provider. Document Revised: 08/14/2018 Document Reviewed: 01/30/2018 Elsevier Patient Education  [place] Instructions  Activity: Get plenty of rest for the remainder of the day. A responsible individual must stay with you for 24 hours following the procedure.  For the next 24 hours, DO NOT: -Drive a car -Paediatric nurse -Drink alcoholic beverages -Take any medication unless instructed by your physician -Make any legal decisions or sign important papers.  Meals: Start with liquid foods such as gelatin or soup. Progress to regular foods as tolerated. Avoid greasy, spicy, heavy foods. If nausea and/or vomiting occur, drink only clear liquids until the nausea and/or vomiting subsides. Call your physician if vomiting continues.  Special Instructions/Symptoms: Your throat may feel dry or sore from the anesthesia or the breathing tube placed in your throat during surgery. If this causes discomfort, gargle with warm salt water. The discomfort should disappear within 24 hours.

## 2019-08-09 NOTE — H&P (Signed)
Peggy Green is an 44 y.o. female.[id number] Divorced.  2 healthy children.  Televisit by phone.  RP: LPS Bilateral Tubal Sterilization  HPI: Patient is divorced with 2 healthy children. She is certain to desire a sterilization procedure.  Pertinent Gynecological History: Menses: Normal regular monthly Contraception: Currently abstinent Blood transfusions: none Sexually transmitted diseases: no past history Last mammogram: normal  Last pap: normal    Menstrual History: Patient's last menstrual period was 08/02/2019 (exact date).    Past Medical History:  Diagnosis Date  . Back pain    herniated disc lower back  . History of cervical dysplasia    s/p  LEEP 03/ 2013  . History of Helicobacter pylori infection    treated 2014  . SUI (stress urinary incontinence, female)   . Vitamin D deficiency     Past Surgical History:  Procedure Laterality Date  . LEEP  06/14/2011   Procedure: LOOP ELECTROSURGICAL EXCISION PROCEDURE (LEEP);  Surgeon: Terrance Mass, MD;  Location: [facility];  Service: Gynecology;  Laterality: N/A;    Family History  Problem Relation Age of Onset  . Thyroid disease Mother   . Hyperlipidemia Mother   . Hypertension Mother   . Cancer Maternal Grandfather        STOMACH    Social History:  reports that she has never smoked. She has never used smokeless tobacco. She reports current alcohol use. She reports that she does not use drugs.  Allergies: No Known Allergies  Medications Prior to Admission  Medication Sig Dispense Refill Last Dose  . acetaminophen (TYLENOL) 500 MG tablet Take 500 mg by mouth every 6 (six) hours as needed.   Past Month at Unknown time  . ibuprofen (ADVIL) 200 MG tablet Take 200 mg by mouth every 6 (six) hours as needed.   Past Month at Unknown time  . phentermine 37.5 MG capsule Take 37.5 mg by mouth every morning.   07/30/2019  . Vitamin D, Ergocalciferol, (DRISDOL) 1.25 MG (50000 UNIT) CAPS capsule Take 1  capsule (50,000 Units total) by mouth every 7 (seven) days. (Patient taking differently: Take 50,000 Units by mouth every 7 (seven) days. Takes on monday) 12 capsule 0 08/05/2019    REVIEW OF SYSTEMS: A ROS was performed and pertinent positives and negatives are included in the history.  GENERAL: No fevers or chills. HEENT: No change in vision, no earache, sore throat or sinus congestion. NECK: No pain or stiffness. CARDIOVASCULAR: No chest pain or pressure. No palpitations. PULMONARY: No shortness of breath, cough or wheeze. GASTROINTESTINAL: No abdominal pain, nausea, vomiting or diarrhea, melena or bright red blood per rectum. GENITOURINARY: No urinary frequency, urgency, hesitancy or dysuria. MUSCULOSKELETAL: No joint or muscle pain, no back pain, no recent trauma. DERMATOLOGIC: No rash, no itching, no lesions. ENDOCRINE: No polyuria, polydipsia, no heat or cold intolerance. No recent change in weight. HEMATOLOGICAL: No anemia or easy bruising or bleeding. NEUROLOGIC: No headache, seizures, numbness, tingling or weakness. PSYCHIATRIC: No depression, no loss of interest in normal activity or change in sleep pattern.     Blood pressure 130/80, pulse 69, temperature 98.3 F (36.8 C), temperature source Oral, resp. rate 18, height 4\' 11"  (1.499 m), weight 87.9 kg, last menstrual period 08/02/2019, SpO2 100 %.  Physical Exam:  See office notes   Results for orders placed or performed during the hospital encounter of 08/09/19 (from the past 24 hour(s))  CBC     Status: None   Collection Time: 08/09/19  9:33 AM  Result Value Ref Range   WBC 6.2 4.0 - 10.5 K/uL   RBC 4.15 3.87 - 5.11 MIL/uL   Hemoglobin 12.3 12.0 - 15.0 g/dL   HCT 38.8 36.0 - 46.0 %   MCV 93.5 80.0 - 100.0 fL   MCH 29.6 26.0 - 34.0 pg   MCHC 31.7 30.0 - 36.0 g/dL   RDW 13.6 11.5 - 15.5 %   Platelets 327 150 - 400 K/uL   nRBC 0.0 0.0 - 0.2 %  Pregnancy, urine POC     Status: None   Collection Time: 08/09/19 10:01 AM  Result  Value Ref Range   Preg Test, Ur NEGATIVE NEGATIVE  Pregnancy, urine POC     Status: None   Collection Time: 08/09/19 10:02 AM  Result Value Ref Range   Preg Test, Ur NEGATIVE NEGATIVE  Type and screen [facility]     Status: None (Preliminary result)   Collection Time: 08/09/19 10:08 AM  Result Value Ref Range   ABO/RH(D) PENDING    Antibody Screen PENDING    Sample Expiration      08/12/2019,2359 Performed at [facility], [place] [address] [place], [place] [address]    Covid Negative Hb 12.3 on 08/09/19  1. Consultation for female sterilization Decision to proceed with laparoscopy bilateral tubal sterilization with cauterization. Preop counseling done. Preop preparation, surgical procedure with risks and benefits as well as postop expectations and precautions thoroughly reviewed. Patient voiced understanding and agreement with plan.                        Patient was counseled as to the risk of surgery to include the following:  1. Infection (prohylactic antibiotics will be administered)  2. DVT/Pulmonary Embolism (prophylactic pneumo compression stockings will be used)  3.Trauma to internal organs requiring additional surgical procedure to repair any injury to internal organs requiring perhaps additional hospitalization days.  4.Hemmorhage requiring transfusion and blood products which carry risks such as anaphylactic reaction, hepatitis and AIDS  Patient had received literature information on the procedure scheduled and all her questions were answered and fully accepts all risk.   Marie-Lyne Lauriel Helin 08/09/2019, 10:30 AM

## 2019-09-18 ENCOUNTER — Encounter: Payer: Self-pay | Admitting: Obstetrics & Gynecology

## 2020-05-07 ENCOUNTER — Encounter: Payer: 59 | Admitting: Obstetrics & Gynecology

## 2020-08-11 ENCOUNTER — Ambulatory Visit (INDEPENDENT_AMBULATORY_CARE_PROVIDER_SITE_OTHER): Payer: 59 | Admitting: Obstetrics & Gynecology

## 2020-08-11 ENCOUNTER — Other Ambulatory Visit: Payer: Self-pay

## 2020-08-11 ENCOUNTER — Other Ambulatory Visit (HOSPITAL_COMMUNITY)
Admission: RE | Admit: 2020-08-11 | Discharge: 2020-08-11 | Disposition: A | Discharge status: Home / Self Care | Payer: 59 | Source: Ambulatory Visit | Attending: Obstetrics & Gynecology | Admitting: Obstetrics & Gynecology

## 2020-08-11 ENCOUNTER — Encounter: Payer: Self-pay | Admitting: Obstetrics & Gynecology

## 2020-08-11 VITALS — BP 120/82 | Ht 59.25 in | Wt 196.0 lb

## 2020-08-11 DIAGNOSIS — Z01419 Encounter for gynecological examination (general) (routine) without abnormal findings: Secondary | ICD-10-CM | POA: Insufficient documentation

## 2020-08-11 DIAGNOSIS — Z113 Encounter for screening for infections with a predominantly sexual mode of transmission: Secondary | ICD-10-CM

## 2020-08-11 DIAGNOSIS — Z9851 Tubal ligation status: Secondary | ICD-10-CM

## 2020-08-11 DIAGNOSIS — N3941 Urge incontinence: Secondary | ICD-10-CM

## 2020-08-11 NOTE — Progress Notes (Signed)
Peggy Green 04/05/1975 [id number]   History:    45 y.o. G2P2L2 Divorced.  S/P BTL. Daughter 30 yo, son 67 yo.  [id number] presenting for annual gyn exam   PYY:FRTMYT menstrual periods every month.No breakthrough bleeding. No pelvic pain. Normal vaginal secretions. Sexually active. Complains of urge incontinence of urine.  No UTI Sx.  BMs normal.  Breasts normal.  BMI increased to 39.25.  Health labs with [facility] NP.  Past medical history,surgical history, family history and social history were all reviewed and documented in the EPIC chart.  Gynecologic History Patient's last menstrual period was 08/02/2020.  Obstetric History OB History  Gravida Para Term Preterm AB Living  2 2 2     2   SAB IAB Ectopic Multiple Live Births          2    # Outcome Date GA Lbr Len/2nd Weight Sex Delivery Anes PTL Lv  2 Term     M Vag-Spont  N LIV  1 Term     F Vag-Spont  N LIV     ROS: A ROS was performed and pertinent positives and negatives are included in the history.  GENERAL: No fevers or chills. HEENT: No change in vision, no earache, sore throat or sinus congestion. NECK: No pain or stiffness. CARDIOVASCULAR: No chest pain or pressure. No palpitations. PULMONARY: No shortness of breath, cough or wheeze. GASTROINTESTINAL: No abdominal pain, nausea, vomiting or diarrhea, melena or bright red blood per rectum. GENITOURINARY: No urinary frequency, urgency, hesitancy or dysuria. MUSCULOSKELETAL: No joint or muscle pain, no back pain, no recent trauma. DERMATOLOGIC: No rash, no itching, no lesions. ENDOCRINE: No polyuria, polydipsia, no heat or cold intolerance. No recent change in weight. HEMATOLOGICAL: No anemia or easy bruising or bleeding. NEUROLOGIC: No headache, seizures, numbness, tingling or weakness. PSYCHIATRIC: No depression, no loss of interest in normal activity or change in sleep pattern.     Exam:   BP 120/82   Ht 4' 11.25" (1.505 m)   Wt 196 lb (88.9 kg)    LMP 08/02/2020 Comment: tubal ligation  BMI 39.25 kg/m   Body mass index is 39.25 kg/m.  General appearance : Well developed well nourished female. No acute distress HEENT: Eyes: no retinal hemorrhage or exudates,  Neck supple, trachea midline, no carotid bruits, no thyroidmegaly Lungs: Clear to auscultation, no rhonchi or wheezes, or rib retractions  Heart: Regular rate and rhythm, no murmurs or gallops Breast:Examined in sitting and supine position were symmetrical in appearance, no palpable masses or tenderness,  no skin retraction, no nipple inversion, no nipple discharge, no skin discoloration, no axillary or supraclavicular lymphadenopathy Abdomen: no palpable masses or tenderness, no rebound or guarding Extremities: no edema or skin discoloration or tenderness  Pelvic: Vulva: Normal             Vagina: No gross lesions or discharge.  Cystocele grade 2-3/4.  Cervix: No gross lesions or discharge.  Pap/HPV HR, Gono-Chlam  Uterus  AV, normal size, shape and consistency, non-tender and mobile  Adnexa  Without masses or tenderness  Anus: Normal   Assessment/Plan:  45 y.o. female for annual exam   1. Encounter for routine gynecological examination with Papanicolaou smear of cervix Normal gynecologic exam.  Pap test with high-risk HPV done.  Breast exam normal.  Screening mammogram June 2021 was negative.  Health labs with family nurse practitioner. - Cytology - PAP( [place])  2. S/P tubal ligation  3. Screen for STD (sexually transmitted disease) Strict  condom use recommended.  STI screen done today. - Gono-Chlam on Pap - HIV antibody (with reflex) - RPR - Hepatitis B Surface AntiGEN - Hepatitis C Antibody  4. Urge incontinence of urine Counseling on urge incontinence done.  Recommend removing bladder irritants such as caffeine products.  Recommend emptying her bladder regularly to avoid overfilling.  Cystocele grade 2-3 over 4.  Refer to urology and PT for evaluation and  management.  Princess Bruins MD, 4:29 PM 08/11/2020

## 2020-08-12 LAB — RPR: RPR Ser Ql: NONREACTIVE

## 2020-08-12 LAB — HIV ANTIBODY (ROUTINE TESTING W REFLEX): HIV 1&2 Ab, 4th Generation: NONREACTIVE

## 2020-08-12 LAB — HEPATITIS B SURFACE ANTIGEN: Hepatitis B Surface Ag: NONREACTIVE

## 2020-08-12 LAB — HEPATITIS C ANTIBODY
Hepatitis C Ab: NONREACTIVE
SIGNAL TO CUT-OFF: 0.01 (ref <1.00)

## 2020-08-13 LAB — CYTOLOGY - PAP
Chlamydia: NEGATIVE
Comment: NEGATIVE
Comment: NEGATIVE
Comment: NORMAL
Diagnosis: NEGATIVE
High risk HPV: NEGATIVE
Neisseria Gonorrhea: NEGATIVE

## 2020-08-14 ENCOUNTER — Encounter: Payer: Self-pay | Admitting: Obstetrics & Gynecology

## 2020-08-21 ENCOUNTER — Telehealth: Payer: Self-pay | Admitting: *Deleted

## 2020-08-21 NOTE — Telephone Encounter (Signed)
-----   Message from Princess Bruins, MD sent at 08/11/2020  4:47 PM EDT ----- Regarding: Refer to Urology/Physical Therapy pelvic floor Urgency/progressively worsening incontinence.

## 2020-08-21 NOTE — Telephone Encounter (Signed)
Office notes faxed to [facility] they will call to schedule. Patient informed.

## 2020-09-02 NOTE — Telephone Encounter (Signed)
Patient scheduled on 09/02/20 @ 3 pm with Mertha Finders, PT

## 2021-08-12 ENCOUNTER — Ambulatory Visit: Payer: 59 | Admitting: Obstetrics & Gynecology

## 2021-10-04 ENCOUNTER — Other Ambulatory Visit (HOSPITAL_COMMUNITY)
Admission: RE | Admit: 2021-10-04 | Discharge: 2021-10-04 | Disposition: A | Discharge status: Home / Self Care | Payer: 59 | Source: Ambulatory Visit | Attending: Obstetrics & Gynecology | Admitting: Obstetrics & Gynecology

## 2021-10-04 ENCOUNTER — Encounter: Payer: Self-pay | Admitting: Obstetrics & Gynecology

## 2021-10-04 ENCOUNTER — Ambulatory Visit (INDEPENDENT_AMBULATORY_CARE_PROVIDER_SITE_OTHER): Payer: 59 | Admitting: Obstetrics & Gynecology

## 2021-10-04 ENCOUNTER — Ambulatory Visit: Payer: 59 | Admitting: Obstetrics & Gynecology

## 2021-10-04 VITALS — BP 104/68 | HR 60 | Resp 16 | Ht 60.0 in | Wt 162.0 lb

## 2021-10-04 DIAGNOSIS — Z113 Encounter for screening for infections with a predominantly sexual mode of transmission: Secondary | ICD-10-CM

## 2021-10-04 DIAGNOSIS — Z9851 Tubal ligation status: Secondary | ICD-10-CM | POA: Diagnosis not present

## 2021-10-04 DIAGNOSIS — Z01419 Encounter for gynecological examination (general) (routine) without abnormal findings: Secondary | ICD-10-CM | POA: Insufficient documentation

## 2021-10-04 DIAGNOSIS — N3941 Urge incontinence: Secondary | ICD-10-CM | POA: Diagnosis not present

## 2021-10-04 LAB — URINALYSIS, COMPLETE W/RFL CULTURE
Bacteria, UA: NONE SEEN /HPF
Bilirubin Urine: NEGATIVE
Casts: NONE SEEN /LPF
Crystals: NONE SEEN /HPF
Glucose, UA: NEGATIVE
Hyaline Cast: NONE SEEN /LPF
Ketones, ur: NEGATIVE
Leukocyte Esterase: NEGATIVE
Nitrites, Initial: NEGATIVE
Protein, ur: NEGATIVE
RBC / HPF: NONE SEEN /HPF (ref 0–2)
Specific Gravity, Urine: 1.025 (ref 1.001–1.035)
WBC, UA: NONE SEEN /HPF (ref 0–5)
Yeast: NONE SEEN /HPF
pH: 5 (ref 5.0–8.0)

## 2021-10-04 LAB — NO CULTURE INDICATED

## 2021-10-04 NOTE — Progress Notes (Signed)
Peggy Green 1975/10/21 [id number]   History:    46 y.o.  G2P2L2 Divorced.  S/P BTL.  Daughter 98 yo, son 58 yo.   RP:  Established patient presenting for annual gyn exam    HPI: Normal menstrual periods every month.  No breakthrough bleeding.  No pelvic pain.  Normal vaginal secretions.  Sexually active.  Would like full STI screen.  Pap Neg 07/2020.  Pap/HPV HR today. Complains of frequency and urge incontinence of urine.  No UTI Sx.  BMs normal.  Breasts normal.  Mammo scheduled for 10/2021.  BMI decreased from 39.25 to 31.64.  Will f/u for Fasting Health labs here.  Past medical history,surgical history, family history and social history were all reviewed and documented in the EPIC chart.  Gynecologic History Patient's last menstrual period was 10/01/2021 (approximate).  Obstetric History OB History  Gravida Para Term Preterm AB Living  _0 SAB IAB Ectopic Multiple Live Births          2    # Outcome Date GA Lbr Len/2nd Weight Sex Delivery Anes PTL Lv  2 Term     M Vag-Spont  N LIV  1 Term     F Vag-Spont  N LIV     ROS: A ROS was performed and pertinent positives and negatives are included in the history.  GENERAL: No fevers or chills. HEENT: No change in vision, no earache, sore throat or sinus congestion. NECK: No pain or stiffness. CARDIOVASCULAR: No chest pain or pressure. No palpitations. PULMONARY: No shortness of breath, cough or wheeze. GASTROINTESTINAL: No abdominal pain, nausea, vomiting or diarrhea, melena or bright red blood per rectum. GENITOURINARY: No urinary frequency, urgency, hesitancy or dysuria. MUSCULOSKELETAL: No joint or muscle pain, no back pain, no recent trauma. DERMATOLOGIC: No rash, no itching, no lesions. ENDOCRINE: No polyuria, polydipsia, no heat or cold intolerance. No recent change in weight. HEMATOLOGICAL: No anemia or easy bruising or bleeding. NEUROLOGIC: No headache, seizures, numbness, tingling or weakness. PSYCHIATRIC: No depression,  no loss of interest in normal activity or change in sleep pattern.     Exam:   BP 104/68   Pulse 60   Resp 16   Ht 5' (1.524 m)   Wt 162 lb (73.5 kg)   LMP 10/01/2021 (Approximate)   BMI 31.64 kg/m   Body mass index is 31.64 kg/m.  General appearance : Well developed well nourished female. No acute distress HEENT: Eyes: no retinal hemorrhage or exudates,  Neck supple, trachea midline, no carotid bruits, no thyroidmegaly Lungs: Clear to auscultation, no rhonchi or wheezes, or rib retractions  Heart: Regular rate and rhythm, no murmurs or gallops Breast:Examined in sitting and supine position were symmetrical in appearance, no palpable masses or tenderness,  no skin retraction, no nipple inversion, no nipple discharge, no skin discoloration, no axillary or supraclavicular lymphadenopathy Abdomen: no palpable masses or tenderness, no rebound or guarding Extremities: no edema or skin discoloration or tenderness  Pelvic: Vulva: Normal             Vagina: No gross lesions or discharge  Cervix: No gross lesions or discharge.  Pap/HPV HR, Gono-Chlam done  Uterus  AV, normal size, shape and consistency, non-tender and mobile  Adnexa  Without masses or tenderness  Anus: Normal  U/A Negative   Assessment/Plan:  46 y.o. female for annual exam   1. Encounter for routine gynecological examination with Papanicolaou smear of cervix Normal menstrual periods  every month.  No breakthrough bleeding.  No pelvic pain.  Normal vaginal secretions.  Sexually active.  Would like full STI screen.  Pap Neg 07/2020.  Pap/HPV HR today. Complains of frequency and urge incontinence of urine.  No UTI Sx.  BMs normal.  Breasts normal.  Mammo scheduled for 10/2021.  BMI decreased from 39.25 to 31.64.  Will f/u for Fasting Health labs here. - CBC; Future - Comp Met (CMET); Future - TSH; Future - Lipid Profile; Future - Vitamin D (25 hydroxy); Future - Cytology - PAP( [place])  2. S/P tubal  ligation  3. Screen for STD (sexually transmitted disease) Condoms recommended - HIV antibody (with reflex) - RPR - Hepatitis B Surface AntiGEN - Hepatitis C Antibody - Cytology - PAP( ) with HPV HR, Gono-Chlam  4. Urge incontinence of urine U/A completely Negative, patient reassured - Urinalysis,Complete w/RFL Culture  Other orders - MOUNJARO 7.5 MG/0.5ML Pen; Inject into the skin.   Princess Bruins MD, 12:09 PM 10/04/2021

## 2021-10-05 LAB — RPR: RPR Ser Ql: NONREACTIVE

## 2021-10-05 LAB — HEPATITIS B SURFACE ANTIGEN: Hepatitis B Surface Ag: NONREACTIVE

## 2021-10-05 LAB — HIV ANTIBODY (ROUTINE TESTING W REFLEX): HIV 1&2 Ab, 4th Generation: NONREACTIVE

## 2021-10-05 LAB — HEPATITIS C ANTIBODY: Hepatitis C Ab: NONREACTIVE

## 2021-10-06 LAB — CYTOLOGY - PAP
Chlamydia: NEGATIVE
Comment: NEGATIVE
Comment: NEGATIVE
Comment: NORMAL
Diagnosis: NEGATIVE
Diagnosis: REACTIVE
High risk HPV: NEGATIVE
Neisseria Gonorrhea: NEGATIVE

## 2021-10-07 ENCOUNTER — Other Ambulatory Visit: Payer: 59

## 2021-10-07 DIAGNOSIS — Z01419 Encounter for gynecological examination (general) (routine) without abnormal findings: Secondary | ICD-10-CM

## 2021-10-08 LAB — COMPREHENSIVE METABOLIC PANEL
AG Ratio: 1.8 (calc) (ref 1.0–2.5)
ALT: 15 U/L (ref 6–29)
AST: 12 U/L (ref 10–30)
Albumin: 4.2 g/dL (ref 3.6–5.1)
Alkaline phosphatase (APISO): 43 U/L (ref 31–125)
BUN: 18 mg/dL (ref 7–25)
CO2: 24 mmol/L (ref 20–32)
Calcium: 9.3 mg/dL (ref 8.6–10.2)
Chloride: 107 mmol/L (ref 98–110)
Creat: 0.79 mg/dL (ref 0.50–0.99)
Globulin: 2.4 g/dL (calc) (ref 1.9–3.7)
Glucose, Bld: 85 mg/dL (ref 65–99)
Potassium: 4.7 mmol/L (ref 3.5–5.3)
Sodium: 139 mmol/L (ref 135–146)
Total Bilirubin: 0.4 mg/dL (ref 0.2–1.2)
Total Protein: 6.6 g/dL (ref 6.1–8.1)

## 2021-10-08 LAB — TSH: TSH: 2.33 mIU/L

## 2021-10-08 LAB — LIPID PANEL
Cholesterol: 228 mg/dL — ABNORMAL HIGH (ref <200)
HDL: 60 mg/dL (ref >=50)
LDL Cholesterol (Calc): 150 mg/dL (calc) — ABNORMAL HIGH
Non-HDL Cholesterol (Calc): 168 mg/dL (calc) — ABNORMAL HIGH (ref <130)
Total CHOL/HDL Ratio: 3.8 (calc) (ref <5.0)
Triglycerides: 75 mg/dL (ref <150)

## 2021-10-08 LAB — CBC
HCT: 35.3 % (ref 35.0–45.0)
Hemoglobin: 11.4 g/dL — ABNORMAL LOW (ref 11.7–15.5)
MCH: 30.3 pg (ref 27.0–33.0)
MCHC: 32.3 g/dL (ref 32.0–36.0)
MCV: 93.9 fL (ref 80.0–100.0)
MPV: 10.6 fL (ref 7.5–12.5)
Platelets: 349 10*3/uL (ref 140–400)
RBC: 3.76 10*6/uL — ABNORMAL LOW (ref 3.80–5.10)
RDW: 12.8 % (ref 11.0–15.0)
WBC: 7.2 10*3/uL (ref 3.8–10.8)

## 2021-10-08 LAB — VITAMIN D 25 HYDROXY (VIT D DEFICIENCY, FRACTURES): Vit D, 25-Hydroxy: 30 ng/mL (ref 30–100)

## 2021-11-19 ENCOUNTER — Encounter: Payer: Self-pay | Admitting: Obstetrics & Gynecology

## 2022-10-13 ENCOUNTER — Ambulatory Visit: Payer: 59 | Admitting: Obstetrics & Gynecology

## 2022-11-09 ENCOUNTER — Encounter: Payer: Self-pay | Admitting: Radiology

## 2022-11-09 ENCOUNTER — Other Ambulatory Visit (HOSPITAL_COMMUNITY)
Admission: RE | Admit: 2022-11-09 | Discharge: 2022-11-09 | Disposition: A | Discharge status: Home / Self Care | Payer: 59 | Source: Ambulatory Visit | Attending: Family Medicine | Admitting: Family Medicine

## 2022-11-09 ENCOUNTER — Ambulatory Visit (INDEPENDENT_AMBULATORY_CARE_PROVIDER_SITE_OTHER): Payer: 59 | Admitting: Radiology

## 2022-11-09 VITALS — BP 116/82 | Ht 59.25 in | Wt 145.0 lb

## 2022-11-09 DIAGNOSIS — Z1211 Encounter for screening for malignant neoplasm of colon: Secondary | ICD-10-CM

## 2022-11-09 DIAGNOSIS — Z9889 Other specified postprocedural states: Secondary | ICD-10-CM | POA: Diagnosis not present

## 2022-11-09 DIAGNOSIS — Z01419 Encounter for gynecological examination (general) (routine) without abnormal findings: Secondary | ICD-10-CM | POA: Diagnosis present

## 2022-11-09 DIAGNOSIS — Z113 Encounter for screening for infections with a predominantly sexual mode of transmission: Secondary | ICD-10-CM | POA: Diagnosis present

## 2022-11-09 DIAGNOSIS — E7800 Pure hypercholesterolemia, unspecified: Secondary | ICD-10-CM

## 2022-11-09 NOTE — Progress Notes (Signed)
Amyracle Scelsi 1975/09/16 [id number]   History:  47 y.o. G2P2 presents for annual exam. Hx of LEEP 2013, normal paps since. Elects for pap today with STI screening. Does not have a PCP, typically does fasting labs here, will order. No other gyn concerns. Does notice breast tenderness and urinary frequency when she drinks  a lot of sweet tea. Otherwise no breast masses or other urinary symptoms.   Gynecologic History Patient's last menstrual period was 10/19/2022 (exact date). Period Cycle (Days): 28 Period Duration (Days): 4 Period Pattern: Regular Menstrual Flow: Light, Heavy Menstrual Control: Tampon, Maxi pad, Thin pad Dysmenorrhea: (!) Mild Dysmenorrhea Symptoms: Cramping Contraception/Family planning: tubal ligation Sexually active: yes Last Pap: 2023. Results were: normal Last mammogram: 2023. Results were: normal  Obstetric History OB History  Gravida Para Term Preterm AB Living  2 2 2     2   SAB IAB Ectopic Multiple Live Births          2    # Outcome Date GA Lbr Len/2nd Weight Sex Type Anes PTL Lv  2 Term     M Vag-Spont  N LIV  1 Term     F Vag-Spont  N LIV     The following portions of the patient's history were reviewed and updated as appropriate: allergies, current medications, past family history, past medical history, past social history, past surgical history, and problem list.  Review of Systems Pertinent items noted in HPI and remainder of comprehensive ROS otherwise negative.   Past medical history, past surgical history, family history and social history were all reviewed and documented in the EPIC chart.   Exam:  Vitals:   11/09/22 1552  BP: 116/82  Weight: 145 lb (65.8 kg)  Height: 4' 11.25" (1.505 m)   Body mass index is 29.04 kg/m.  General appearance:  Normal Thyroid:  Symmetrical, normal in size, without palpable masses or nodularity. Respiratory  Auscultation:  Clear without wheezing or rhonchi Cardiovascular  Auscultation:  Regular  rate, without rubs, murmurs or gallops  Edema/varicosities:  Not grossly evident Abdominal  Soft,nontender, without masses, guarding or rebound.  Liver/spleen:  No organomegaly noted  Hernia:  None appreciated  Skin  Inspection:  Grossly normal Breasts: Examined lying and sitting.   Right: Without masses, retractions, nipple discharge or axillary adenopathy.   Left: Without masses, retractions, nipple discharge or axillary adenopathy. Genitourinary   Inguinal/mons:  Normal without inguinal adenopathy  External genitalia:  Normal appearing vulva with no masses, tenderness, or lesions  BUS/Urethra/Skene's glands:  Normal without masses or exudate  Vagina:  Normal appearing with normal color and discharge, no lesions  Cervix:  Normal appearing without discharge or lesions  Uterus:  Normal in size, shape and contour.  Mobile, nontender  Adnexa/parametria:     Rt: Normal in size, without masses or tenderness.   Lt: Normal in size, without masses or tenderness.  Anus and perineum: Normal   Raynelle Fanning, CMA present for exam  Assessment/Plan:   1. Well woman exam with routine gynecological exam - Cytology - PAP( [place]) - Schedule mammogram, due in 2 weeks  2. H/O LEEP - Cytology - PAP( [place])  3. Screening for STDs (sexually transmitted diseases) - Cytology - PAP( [place]) - HIV antibody (with reflex); Future - RPR; Future - Hepatitis B Surface AntiGEN; Future - Hepatitis C antibody; Future  4. Elevated cholesterol - Lipid Profile; Future - HgB A1c; Future - CBC; Future - Comp Met (CMET); Future - TSH; Future  5. Colon cancer screening - Cologuard    Discussed SBE, colonoscopy and DEXA screening as directed/appropriate. Recommend of exercise weekly, including weight bearing exercise. Encouraged the use of seatbelts and sunscreen. Return in 1 year for annual or as needed.   Arlie Solomons B WHNP-BC 4:11 PM 11/09/2022

## 2022-11-10 ENCOUNTER — Other Ambulatory Visit: Payer: 59

## 2022-11-10 DIAGNOSIS — E7800 Pure hypercholesterolemia, unspecified: Secondary | ICD-10-CM

## 2022-11-10 DIAGNOSIS — Z113 Encounter for screening for infections with a predominantly sexual mode of transmission: Secondary | ICD-10-CM

## 2022-11-11 LAB — CYTOLOGY - PAP
Chlamydia: NEGATIVE
Comment: NEGATIVE
Comment: NEGATIVE
Comment: NEGATIVE
Comment: NORMAL
Diagnosis: NEGATIVE
High risk HPV: NEGATIVE
Neisseria Gonorrhea: NEGATIVE
Trichomonas: NEGATIVE

## 2022-11-11 LAB — COMPREHENSIVE METABOLIC PANEL
AG Ratio: 1.5 (calc) (ref 1.0–2.5)
ALT: 12 U/L (ref 6–29)
AST: 12 U/L (ref 10–35)
Albumin: 4.1 g/dL (ref 3.6–5.1)
Alkaline phosphatase (APISO): 50 U/L (ref 31–125)
BUN: 17 mg/dL (ref 7–25)
CO2: 24 mmol/L (ref 20–32)
Calcium: 9 mg/dL (ref 8.6–10.2)
Chloride: 105 mmol/L (ref 98–110)
Creat: 0.79 mg/dL (ref 0.50–0.99)
Globulin: 2.7 g/dL (ref 1.9–3.7)
Glucose, Bld: 82 mg/dL (ref 65–99)
Potassium: 3.8 mmol/L (ref 3.5–5.3)
Sodium: 136 mmol/L (ref 135–146)
Total Bilirubin: 0.5 mg/dL (ref 0.2–1.2)
Total Protein: 6.8 g/dL (ref 6.1–8.1)

## 2022-11-11 LAB — CBC
HCT: 36.8 % (ref 35.0–45.0)
Hemoglobin: 12 g/dL (ref 11.7–15.5)
MCH: 29.9 pg (ref 27.0–33.0)
MCHC: 32.6 g/dL (ref 32.0–36.0)
MCV: 91.5 fL (ref 80.0–100.0)
MPV: 10.5 fL (ref 7.5–12.5)
Platelets: 358 10*3/uL (ref 140–400)
RBC: 4.02 10*6/uL (ref 3.80–5.10)
RDW: 12.6 % (ref 11.0–15.0)
WBC: 5.6 10*3/uL (ref 3.8–10.8)

## 2022-11-11 LAB — HEMOGLOBIN A1C
Hgb A1c MFr Bld: 5.2 %{Hb} (ref <5.7)
Mean Plasma Glucose: 103 mg/dL
eAG (mmol/L): 5.7 mmol/L

## 2022-11-11 LAB — LIPID PANEL
Cholesterol: 246 mg/dL — ABNORMAL HIGH (ref <200)
HDL: 70 mg/dL (ref >=50)
LDL Cholesterol (Calc): 159 mg/dL — ABNORMAL HIGH
Non-HDL Cholesterol (Calc): 176 mg/dL (calc) — ABNORMAL HIGH (ref <130)
Total CHOL/HDL Ratio: 3.5 (calc) (ref <5.0)
Triglycerides: 70 mg/dL (ref <150)

## 2022-11-11 LAB — HEPATITIS B SURFACE ANTIGEN: Hepatitis B Surface Ag: NONREACTIVE

## 2022-11-11 LAB — HIV ANTIBODY (ROUTINE TESTING W REFLEX): HIV 1&2 Ab, 4th Generation: NONREACTIVE

## 2022-11-11 LAB — TSH: TSH: 2.56 m[IU]/L

## 2022-11-11 LAB — RPR: RPR Ser Ql: NONREACTIVE

## 2022-11-11 LAB — HEPATITIS C ANTIBODY: Hepatitis C Ab: NONREACTIVE

## 2022-11-28 LAB — COLOGUARD: COLOGUARD: NEGATIVE

## 2023-01-04 ENCOUNTER — Encounter: Payer: Self-pay | Admitting: Obstetrics and Gynecology

## 2023-11-30 LAB — HM MAMMOGRAPHY

## 2023-12-04 ENCOUNTER — Ambulatory Visit: Payer: Self-pay | Admitting: Obstetrics and Gynecology

## 2023-12-04 ENCOUNTER — Encounter: Payer: Self-pay | Admitting: Obstetrics and Gynecology
